# Patient Record
Sex: Female | Born: 1970 | Race: Black or African American | Hispanic: No | Marital: Single | Smoking: Never smoker
Health system: Southern US, Community
[De-identification: ages and names within clinical notes are randomized; demographics above are authoritative.]

## PROBLEM LIST (undated history)

## (undated) DIAGNOSIS — M069 Rheumatoid arthritis, unspecified: Secondary | ICD-10-CM

## (undated) HISTORY — PX: MASTECTOMY: SHX3

## (undated) HISTORY — PX: ABDOMINAL HYSTERECTOMY: SHX81

---

## 1997-10-19 ENCOUNTER — Encounter: Admission: RE | Admit: 1997-10-19 | Discharge: 1997-10-19 | Payer: Self-pay | Admitting: Family Medicine

## 1997-11-11 ENCOUNTER — Encounter: Admission: RE | Admit: 1997-11-11 | Discharge: 1997-11-11 | Payer: Self-pay | Admitting: Family Medicine

## 1997-11-17 ENCOUNTER — Encounter: Admission: RE | Admit: 1997-11-17 | Discharge: 1997-11-17 | Payer: Self-pay | Admitting: Family Medicine

## 1998-02-15 ENCOUNTER — Encounter: Admission: RE | Admit: 1998-02-15 | Discharge: 1998-02-15 | Payer: Self-pay | Admitting: Family Medicine

## 1998-02-22 ENCOUNTER — Encounter: Admission: RE | Admit: 1998-02-22 | Discharge: 1998-02-22 | Payer: Self-pay | Admitting: Family Medicine

## 1998-07-14 ENCOUNTER — Encounter: Admission: RE | Admit: 1998-07-14 | Discharge: 1998-07-14 | Payer: Self-pay | Admitting: Family Medicine

## 1998-08-02 ENCOUNTER — Other Ambulatory Visit: Admission: RE | Admit: 1998-08-02 | Discharge: 1998-08-02 | Payer: Self-pay | Admitting: Gynecology

## 1999-01-29 ENCOUNTER — Encounter: Admission: RE | Admit: 1999-01-29 | Discharge: 1999-01-29 | Payer: Self-pay | Admitting: Family Medicine

## 1999-02-21 ENCOUNTER — Encounter: Admission: RE | Admit: 1999-02-21 | Discharge: 1999-02-21 | Payer: Self-pay | Admitting: Family Medicine

## 1999-04-01 ENCOUNTER — Encounter (INDEPENDENT_AMBULATORY_CARE_PROVIDER_SITE_OTHER): Payer: Self-pay | Admitting: *Deleted

## 1999-04-01 LAB — CONVERTED CEMR LAB

## 1999-04-06 ENCOUNTER — Encounter: Admission: RE | Admit: 1999-04-06 | Discharge: 1999-04-06 | Payer: Self-pay | Admitting: Family Medicine

## 1999-04-16 ENCOUNTER — Encounter: Admission: RE | Admit: 1999-04-16 | Discharge: 1999-04-16 | Payer: Self-pay | Admitting: Family Medicine

## 1999-05-18 ENCOUNTER — Encounter: Admission: RE | Admit: 1999-05-18 | Discharge: 1999-05-18 | Payer: Self-pay | Admitting: Sports Medicine

## 1999-08-07 ENCOUNTER — Encounter: Admission: RE | Admit: 1999-08-07 | Discharge: 1999-08-07 | Payer: Self-pay | Admitting: Sports Medicine

## 1999-09-26 ENCOUNTER — Encounter: Admission: RE | Admit: 1999-09-26 | Discharge: 1999-09-26 | Payer: Self-pay | Admitting: Family Medicine

## 1999-10-23 ENCOUNTER — Encounter: Admission: RE | Admit: 1999-10-23 | Discharge: 1999-10-23 | Payer: Self-pay | Admitting: Sports Medicine

## 1999-12-26 ENCOUNTER — Encounter: Admission: RE | Admit: 1999-12-26 | Discharge: 1999-12-26 | Payer: Self-pay | Admitting: Family Medicine

## 2000-03-06 ENCOUNTER — Encounter: Admission: RE | Admit: 2000-03-06 | Discharge: 2000-03-06 | Payer: Self-pay | Admitting: Family Medicine

## 2000-05-13 ENCOUNTER — Encounter: Admission: RE | Admit: 2000-05-13 | Discharge: 2000-05-13 | Payer: Self-pay | Admitting: Sports Medicine

## 2001-02-26 ENCOUNTER — Other Ambulatory Visit: Admission: RE | Admit: 2001-02-26 | Discharge: 2001-02-26 | Payer: Self-pay | Admitting: Family Medicine

## 2001-03-13 ENCOUNTER — Ambulatory Visit (HOSPITAL_COMMUNITY): Admission: RE | Admit: 2001-03-13 | Discharge: 2001-03-13 | Payer: Self-pay | Admitting: Family Medicine

## 2002-11-04 ENCOUNTER — Other Ambulatory Visit: Admission: RE | Admit: 2002-11-04 | Discharge: 2002-11-04 | Payer: Self-pay | Admitting: Gynecology

## 2003-04-05 ENCOUNTER — Encounter: Admission: RE | Admit: 2003-04-05 | Discharge: 2003-04-05 | Payer: Self-pay | Admitting: Obstetrics and Gynecology

## 2003-06-14 ENCOUNTER — Other Ambulatory Visit: Admission: RE | Admit: 2003-06-14 | Discharge: 2003-06-14 | Payer: Self-pay | Admitting: Obstetrics & Gynecology

## 2003-06-14 ENCOUNTER — Encounter: Admission: RE | Admit: 2003-06-14 | Discharge: 2003-06-14 | Payer: Self-pay | Admitting: Obstetrics and Gynecology

## 2003-06-27 ENCOUNTER — Inpatient Hospital Stay (HOSPITAL_COMMUNITY): Admission: AD | Admit: 2003-06-27 | Discharge: 2003-06-27 | Payer: Self-pay | Admitting: Obstetrics and Gynecology

## 2003-06-30 ENCOUNTER — Encounter: Admission: RE | Admit: 2003-06-30 | Discharge: 2003-06-30 | Payer: Self-pay | Admitting: Obstetrics and Gynecology

## 2003-08-30 ENCOUNTER — Encounter: Admission: RE | Admit: 2003-08-30 | Discharge: 2003-08-30 | Payer: Self-pay | Admitting: Obstetrics and Gynecology

## 2003-09-28 ENCOUNTER — Inpatient Hospital Stay (HOSPITAL_COMMUNITY): Admission: RE | Admit: 2003-09-28 | Discharge: 2003-09-29 | Payer: Self-pay | Admitting: Obstetrics & Gynecology

## 2003-10-11 ENCOUNTER — Encounter: Admission: RE | Admit: 2003-10-11 | Discharge: 2003-10-11 | Payer: Self-pay | Admitting: Obstetrics and Gynecology

## 2003-11-08 ENCOUNTER — Encounter: Admission: RE | Admit: 2003-11-08 | Discharge: 2003-11-08 | Payer: Self-pay | Admitting: Obstetrics and Gynecology

## 2004-02-03 ENCOUNTER — Inpatient Hospital Stay (HOSPITAL_COMMUNITY): Admission: AD | Admit: 2004-02-03 | Discharge: 2004-02-03 | Payer: Self-pay | Admitting: *Deleted

## 2004-03-02 ENCOUNTER — Ambulatory Visit: Payer: Self-pay | Admitting: Family Medicine

## 2004-03-09 ENCOUNTER — Ambulatory Visit: Payer: Self-pay | Admitting: Internal Medicine

## 2004-03-16 ENCOUNTER — Ambulatory Visit: Payer: Self-pay | Admitting: Family Medicine

## 2004-03-27 ENCOUNTER — Ambulatory Visit: Payer: Self-pay | Admitting: Obstetrics & Gynecology

## 2004-04-10 ENCOUNTER — Ambulatory Visit: Payer: Self-pay | Admitting: Obstetrics & Gynecology

## 2004-04-17 ENCOUNTER — Ambulatory Visit: Payer: Self-pay | Admitting: Obstetrics & Gynecology

## 2006-08-29 ENCOUNTER — Encounter (INDEPENDENT_AMBULATORY_CARE_PROVIDER_SITE_OTHER): Payer: Self-pay | Admitting: *Deleted

## 2010-07-21 ENCOUNTER — Encounter: Payer: Self-pay | Admitting: Family Medicine

## 2013-01-08 ENCOUNTER — Encounter (HOSPITAL_COMMUNITY): Payer: Self-pay

## 2013-01-08 ENCOUNTER — Other Ambulatory Visit (HOSPITAL_COMMUNITY)
Admission: RE | Admit: 2013-01-08 | Discharge: 2013-01-08 | Disposition: A | Discharge status: Home / Self Care | Payer: 59 | Source: Ambulatory Visit | Attending: Family Medicine | Admitting: Family Medicine

## 2013-01-08 ENCOUNTER — Emergency Department (INDEPENDENT_AMBULATORY_CARE_PROVIDER_SITE_OTHER)
Admission: EM | Admit: 2013-01-08 | Discharge: 2013-01-08 | Disposition: A | Discharge status: Home / Self Care | Payer: 59 | Source: Home / Self Care

## 2013-01-08 DIAGNOSIS — Z113 Encounter for screening for infections with a predominantly sexual mode of transmission: Secondary | ICD-10-CM | POA: Insufficient documentation

## 2013-01-08 DIAGNOSIS — N898 Other specified noninflammatory disorders of vagina: Secondary | ICD-10-CM

## 2013-01-08 DIAGNOSIS — N760 Acute vaginitis: Secondary | ICD-10-CM | POA: Insufficient documentation

## 2013-01-08 LAB — POCT URINALYSIS DIP (DEVICE)
Bilirubin Urine: NEGATIVE
Leukocytes, UA: NEGATIVE
Nitrite: NEGATIVE
pH: 6 (ref 5.0–8.0)

## 2013-01-08 NOTE — ED Provider Notes (Signed)
Medical screening examination/treatment/procedure(s) were performed by resident physician or non-physician practitioner and as supervising physician I was immediately available for consultation/collaboration.   Barkley Bruns MD.   Linna Hoff, MD 01/08/13 1640

## 2013-01-08 NOTE — ED Notes (Signed)
Concern for UTI; frequency, lower abdominal pressure. DENIES ANY concern for STD's; needs medication for yeast w any antibitics

## 2013-01-08 NOTE — ED Notes (Signed)
Call back number for lab issues verified 

## 2013-01-08 NOTE — ED Provider Notes (Signed)
Gloria Bowman is a 42 y.o. female who presents to Urgent Care today for urinary tract infection. Patient notes urgency frequency and irritation over the last 2 days. This is consistent with prior UTIs. She denies any discharge but does note some mild vaginal irritation. She denies any fevers or chills nausea vomiting or diarrhea. She is well otherwise.    PMH reviewed. Otherwise healthy History  Substance Use Topics  . Smoking status: Not on file  . Smokeless tobacco: Not on file  . Alcohol Use: Not on file   ROS as above Medications reviewed. No current facility-administered medications for this encounter.   No current outpatient prescriptions on file.    Exam:  BP 117/80  Pulse 90  Temp(Src) 98 F (36.7 C) (Oral)  Resp 18  SpO2 100% Gen: Well NAD HEENT: EOMI,  MMM Lungs: CTABL Nl WOB Heart: RRR no MRG Abd: NABS, NT, ND Exts: Non edematous BL  LE, warm and well perfused.  Vaginal exam: Normal external genitalia Thick white discharge in vaginal canal Normal-appearing cervix No cervical motion tenderness or masses with bimanual exam  Results for orders placed during the hospital encounter of 01/08/13 (from the past 24 hour(s))  POCT URINALYSIS DIP (DEVICE)     Status: None   Collection Time    01/08/13 12:20 PM      Result Value Range   Glucose, UA NEGATIVE  NEGATIVE mg/dL   Bilirubin Urine NEGATIVE  NEGATIVE   Ketones, ur NEGATIVE  NEGATIVE mg/dL   Specific Gravity, Urine >=1.030  1.005 - 1.030   Hgb urine dipstick NEGATIVE  NEGATIVE   pH 6.0  5.0 - 8.0   Protein, ur NEGATIVE  NEGATIVE mg/dL   Urobilinogen, UA 0.2  0.0 - 1.0 mg/dL   Nitrite NEGATIVE  NEGATIVE   Leukocytes, UA NEGATIVE  NEGATIVE   No results found.  Assessment and Plan: 42 y.o. female with dysuria with vaginal discharge. Unclear cause at this time Plan: Urine culture, wet prep, GC, chlamydia testing Will call patient with results and started appropriate treatment at that time Discussed warning  signs or symptoms. Please see discharge instructions. Patient expresses understanding.      Rodolph Bong, MD 01/08/13 1248

## 2013-01-09 LAB — URINE CULTURE

## 2013-01-11 ENCOUNTER — Telehealth (HOSPITAL_COMMUNITY): Payer: Self-pay | Admitting: Family Medicine

## 2013-01-11 MED ORDER — METRONIDAZOLE 500 MG PO TABS: 500.0000 mg | ORAL_TABLET | Freq: Two times a day (BID) | ORAL | Status: DC

## 2013-01-11 NOTE — ED Notes (Signed)
Called patient back with test results.  Will treat with flagyl  Rodolph Bong, MD 01/11/13 1500

## 2013-01-12 NOTE — ED Notes (Signed)
GC/Chlamydia neg., Affirm: Candida and Trich neg., Gardnerella pos., Urine culture: 2,000 colonies insignificant growth.  Pt. adequately treated with Flagyl. 7/14  Pt. notified and treated by Dr. Denyse Amass. Vassie Moselle 01/12/2013

## 2017-02-03 ENCOUNTER — Encounter (HOSPITAL_BASED_OUTPATIENT_CLINIC_OR_DEPARTMENT_OTHER): Payer: Self-pay | Admitting: Emergency Medicine

## 2017-02-03 ENCOUNTER — Emergency Department (HOSPITAL_BASED_OUTPATIENT_CLINIC_OR_DEPARTMENT_OTHER): Payer: Self-pay

## 2017-02-03 ENCOUNTER — Emergency Department (HOSPITAL_BASED_OUTPATIENT_CLINIC_OR_DEPARTMENT_OTHER)
Admission: EM | Admit: 2017-02-03 | Discharge: 2017-02-03 | Disposition: A | Discharge status: Home / Self Care | Payer: Self-pay | Attending: Emergency Medicine | Admitting: Emergency Medicine

## 2017-02-03 DIAGNOSIS — Y92008 Other place in unspecified non-institutional (private) residence as the place of occurrence of the external cause: Secondary | ICD-10-CM | POA: Insufficient documentation

## 2017-02-03 DIAGNOSIS — S3210XA Unspecified fracture of sacrum, initial encounter for closed fracture: Secondary | ICD-10-CM | POA: Insufficient documentation

## 2017-02-03 DIAGNOSIS — Y9389 Activity, other specified: Secondary | ICD-10-CM | POA: Insufficient documentation

## 2017-02-03 DIAGNOSIS — E860 Dehydration: Secondary | ICD-10-CM | POA: Insufficient documentation

## 2017-02-03 DIAGNOSIS — W108XXA Fall (on) (from) other stairs and steps, initial encounter: Secondary | ICD-10-CM | POA: Insufficient documentation

## 2017-02-03 DIAGNOSIS — Y999 Unspecified external cause status: Secondary | ICD-10-CM | POA: Insufficient documentation

## 2017-02-03 DIAGNOSIS — M069 Rheumatoid arthritis, unspecified: Secondary | ICD-10-CM | POA: Insufficient documentation

## 2017-02-03 DIAGNOSIS — R42 Dizziness and giddiness: Secondary | ICD-10-CM | POA: Insufficient documentation

## 2017-02-03 DIAGNOSIS — W19XXXA Unspecified fall, initial encounter: Secondary | ICD-10-CM

## 2017-02-03 HISTORY — DX: Rheumatoid arthritis, unspecified: M06.9

## 2017-02-03 LAB — COMPREHENSIVE METABOLIC PANEL
ALT: 14 U/L (ref 14–54)
ANION GAP: 7 (ref 5–15)
AST: 19 U/L (ref 15–41)
Albumin: 3.8 g/dL (ref 3.5–5.0)
Alkaline Phosphatase: 63 U/L (ref 38–126)
BUN: 15 mg/dL (ref 6–20)
CHLORIDE: 102 mmol/L (ref 101–111)
CO2: 28 mmol/L (ref 22–32)
Calcium: 9.2 mg/dL (ref 8.9–10.3)
Creatinine, Ser: 0.69 mg/dL (ref 0.44–1.00)
GFR calc non Af Amer: >60 mL/min (ref >60)
Glucose, Bld: 101 mg/dL — ABNORMAL HIGH (ref 65–99)
POTASSIUM: 4.3 mmol/L (ref 3.5–5.1)
SODIUM: 137 mmol/L (ref 135–145)
Total Bilirubin: 0.6 mg/dL (ref 0.3–1.2)
Total Protein: 7.5 g/dL (ref 6.5–8.1)

## 2017-02-03 LAB — CBC WITH DIFFERENTIAL/PLATELET
Basophils Absolute: 0 10*3/uL (ref 0.0–0.1)
Basophils Relative: 0 %
EOS ABS: 0 10*3/uL (ref 0.0–0.7)
EOS PCT: 0 %
HCT: 40.4 % (ref 36.0–46.0)
Hemoglobin: 13.3 g/dL (ref 12.0–15.0)
Lymphocytes Relative: 19 %
Lymphs Abs: 1.3 10*3/uL (ref 0.7–4.0)
MCH: 29.4 pg (ref 26.0–34.0)
MCHC: 32.9 g/dL (ref 30.0–36.0)
MCV: 89.4 fL (ref 78.0–100.0)
MONO ABS: 0.4 10*3/uL (ref 0.1–1.0)
Monocytes Relative: 6 %
Neutro Abs: 5 10*3/uL (ref 1.7–7.7)
Neutrophils Relative %: 75 %
PLATELETS: 149 10*3/uL — AB (ref 150–400)
RBC: 4.52 MIL/uL (ref 3.87–5.11)
RDW: 12.7 % (ref 11.5–15.5)
WBC: 6.7 10*3/uL (ref 4.0–10.5)

## 2017-02-03 LAB — URINALYSIS, ROUTINE W REFLEX MICROSCOPIC
BILIRUBIN URINE: NEGATIVE
Glucose, UA: NEGATIVE mg/dL
HGB URINE DIPSTICK: NEGATIVE
KETONES UR: NEGATIVE mg/dL
Leukocytes, UA: NEGATIVE
Nitrite: NEGATIVE
PROTEIN: NEGATIVE mg/dL
Specific Gravity, Urine: 1.02 (ref 1.005–1.030)
pH: 7.5 (ref 5.0–8.0)

## 2017-02-03 MED ORDER — TRAMADOL HCL 50 MG PO TABS: 50.0000 mg | ORAL_TABLET | Freq: Four times a day (QID) | ORAL | 0 refills | Status: DC | PRN

## 2017-02-03 MED ORDER — SODIUM CHLORIDE 0.9 % IV BOLUS (SEPSIS)
1000.0000 mL | Freq: Once | INTRAVENOUS | Status: AC
  Administered 2017-02-03: 1000 mL via INTRAVENOUS

## 2017-02-03 MED ORDER — METOCLOPRAMIDE HCL 5 MG/ML IJ SOLN
10.0000 mg | Freq: Once | INTRAMUSCULAR | Status: AC
  Administered 2017-02-03: 10 mg via INTRAVENOUS
  Filled 2017-02-03: qty 2

## 2017-02-03 MED ORDER — IBUPROFEN 800 MG PO TABS: 800.0000 mg | ORAL_TABLET | Freq: Three times a day (TID) | ORAL | 0 refills | Status: DC | PRN

## 2017-02-03 MED ORDER — KETOROLAC TROMETHAMINE 30 MG/ML IJ SOLN
30.0000 mg | Freq: Once | INTRAMUSCULAR | Status: AC
Start: 2017-02-03 — End: 2017-02-03
  Administered 2017-02-03: 30 mg via INTRAVENOUS
  Filled 2017-02-03: qty 1

## 2017-02-03 MED FILL — IBUPROFEN 800 MG TABLET: 800 | 7 days supply | Qty: 21 | Fill #0

## 2017-02-03 NOTE — ED Triage Notes (Signed)
Patient reports feeling dizzy which began this morning after waking up.  States that she fell and hit her "tailbone".  Seen by PCP this morning and referred to ER for further workup.  Reports feeling fatigued recently.

## 2017-02-03 NOTE — Discharge Instructions (Signed)
As we discussed, we believe her symptoms are caused today by mild volume depletion, or mild dehydration, without any evidence of damage to your body.  Please drink plenty of clear fluids such as water and/or Gatorade and follow up with your regular doctor or the doctors listed in his documentation at the next available opportunity.  Return to the emergency department with any new or worsening symptoms that concern you, including but not limited to fever, shortness of breath, chest pain, or other concerning symptoms.  Follow up with ortho in 1 week.    Dehydration, Adult Dehydration is when you lose more fluids from the body than you take in. Vital organs like the kidneys, brain, and heart cannot function without a proper amount of fluids and salt. Any loss of fluids from the body can cause dehydration.  CAUSES  Vomiting. Diarrhea. Excessive sweating. Excessive urine output. Fever. SYMPTOMS  Mild dehydration Thirst. Dry lips. Slightly dry mouth. Moderate dehydration Very dry mouth. Sunken eyes. Skin does not bounce back quickly when lightly pinched and released. Dark urine and decreased urine production. Decreased tear production. Headache. Severe dehydration Very dry mouth. Extreme thirst. Rapid, weak pulse (more than 100 beats per minute at rest). Cold hands and feet. Not able to sweat in spite of heat and temperature. Rapid breathing. Blue lips. Confusion and lethargy. Difficulty being awakened. Minimal urine production. No tears. DIAGNOSIS  Your caregiver will diagnose dehydration based on your symptoms and your exam. Blood and urine tests will help confirm the diagnosis. The diagnostic evaluation should also identify the cause of dehydration. TREATMENT  Treatment of mild or moderate dehydration can often be done at home by increasing the amount of fluids that you drink. It is best to drink small amounts of fluid more often. Drinking too much at one time can make vomiting  worse. Refer to the home care instructions below. Severe dehydration needs to be treated at the hospital where you will probably be given intravenous (IV) fluids that contain water and electrolytes. HOME CARE INSTRUCTIONS  Ask your caregiver about specific rehydration instructions. Drink enough fluids to keep your urine clear or pale yellow. Drink small amounts frequently if you have nausea and vomiting. Eat as you normally do. Avoid: Foods or drinks high in sugar. Carbonated drinks. Juice. Extremely hot or cold fluids. Drinks with caffeine. Fatty, greasy foods. Alcohol. Tobacco. Overeating. Gelatin desserts. Wash your hands well to avoid spreading bacteria and viruses. Only take over-the-counter or prescription medicines for pain, discomfort, or fever as directed by your caregiver. Ask your caregiver if you should continue all prescribed and over-the-counter medicines. Keep all follow-up appointments with your caregiver. SEEK MEDICAL CARE IF: You have abdominal pain and it increases or stays in one area (localizes). You have a rash, stiff neck, or severe headache. You are irritable, sleepy, or difficult to awaken. You are weak, dizzy, or extremely thirsty. SEEK IMMEDIATE MEDICAL CARE IF:  You are unable to keep fluids down or you get worse despite treatment. You have frequent episodes of vomiting or diarrhea. You have blood or green matter (bile) in your vomit. You have blood in your stool or your stool looks black and tarry. You have not urinated in 6 to 8 hours, or you have only urinated a small amount of very dark urine. You have a fever. You faint. MAKE SURE YOU:  Understand these instructions. Will watch your condition. Will get help right away if you are not doing well or get worse. Document Released: 06/17/2005 Document  Revised: 09/09/2011 Document Reviewed: 02/04/2011 [facility] Patient Information 2015 [place], [place]. This information is not intended to replace advice  given to you by your health care provider. Make sure you discuss any questions you have with your health care provider.  Rehydration, Adult Rehydration is the replacement of body fluids lost during dehydration. Dehydration is an extreme loss of body fluids to the point of body function impairment. There are many ways extreme fluid loss can occur, including vomiting, diarrhea, or excess sweating. Recovering from dehydration requires replacing lost fluids, continuing to eat to maintain strength, and avoiding foods and beverages that may contribute to further fluid loss or may increase nausea. HOW TO REHYDRATE In most cases, rehydration involves the replacement of not only fluids but also carbohydrates and basic body salts. Rehydration with an oral rehydration solution is one way to replace essential nutrients lost through dehydration. An oral rehydration solution can be purchased at pharmacies, retail stores, and online. Premixed packets of powder that you combine with water to make a solution are also sold. You can prepare an oral rehydration solution at home by mixing the following ingredients together:   - tsp table salt.  tsp baking soda.  tsp salt substitute containing potassium chloride. 1 tablespoons sugar. 1 L (34 oz) of water. Be sure to use exact measurements. Including too much sugar can make diarrhea worse. Drink -1 cup (120-240 mL) of oral rehydration solution each time you have diarrhea or vomit. If drinking this amount makes your vomiting worse, try drinking smaller amounts more often. For example, drink 1-3 tsp every 5-10 minutes.  A general rule for staying hydrated is to drink 1-2 L of fluid per day. Talk to your caregiver about the specific amount you should be drinking each day. Drink enough fluids to keep your urine clear or pale yellow. EATING WHEN DEHYDRATED Even if you have had severe sweating or you are having diarrhea, do not stop eating. Many healthy items in a normal diet  are okay to continue eating while recovering from dehydration. The following tips can help you to lessen nausea when you eat: Ask someone else to prepare your food. Cooking smells may worsen nausea. Eat in a well-ventilated room away from cooking smells. Sit up when you eat. Avoid lying down until 1-2 hours after eating. Eat small amounts when you eat. Eat foods that are easy to digest. These include soft, well-cooked, or mashed foods. FOODS AND BEVERAGES TO AVOID Avoid eating or drinking the following foods and beverages that may increase nausea or further loss of fluid:  Fruit juices with a high sugar content, such as concentrated juices. Alcohol. Beverages containing caffeine. Carbonated drinks. They may cause a lot of gas. Foods that may cause a lot of gas, such as cabbage, broccoli, and beans. Fatty, greasy, and fried foods. Spicy, very salty, and very sweet foods or drinks. Foods or drinks that are very hot or very cold. Consume food or drinks at or near room temperature. Foods that need a lot of chewing, such as raw vegetables. Foods that are sticky or hard to swallow, such as peanut butter. Document Released: 09/09/2011 Document Revised: 03/11/2012 Document Reviewed: 09/09/2011 [facility] Patient Information 2015 [place], [place]. This information is not intended to replace advice given to you by your health care provider. Make sure you discuss any questions you have with your health care provider.

## 2017-02-03 NOTE — ED Provider Notes (Signed)
Emergency Department Provider Note   I have reviewed the triage vital signs and the nursing notes.   HISTORY  Chief Complaint Dizziness   HPI CANESHA TESFAYE is a 46 y.o. female with PMH of RA presents to the emergency department for evaluation of sudden onset lightheadedness, severe fatigue, migraine headache, and nausea. Symptoms began shortly after waking this morning. The patient stood up out of bed and began walking down the steps at which point she felt very lightheaded. She lost her footing and fell down directly onto her buttocks. She has pain in that area was able to drive to work. On driving and she felt severe fatigue and nausea since returned home. At that time she developed a migraine type headache that feels typical of her prior migraines. She had some spots in her vision which is also typical of that. She denies any vertigo symptoms. As any unilateral weakness or numbness. She has had some urinary urgency but no hesitancy or dysuria. Denies any vaginal bleeding or discharge. She called her primary care physician and went to urgent care. She reports low blood pressures there but cannot recall the exact numbers. No chest pain, palpitations, shortness of breath during the episode.   Past Medical History:  Diagnosis Date  . RA (rheumatoid arthritis) (HCC)     There are no active problems to display for this patient.   Past Surgical History:  Procedure Laterality Date  . ABDOMINAL HYSTERECTOMY    . MASTECTOMY      Current Outpatient Rx  . Order #: [id number] Class: Print  . Order #: [id number] Class: Normal  . Order #: [id number] Class: Print    Allergies Patient has no known allergies.  History reviewed. No pertinent family history.  Social History Social History  Substance Use Topics  . Smoking status: Never Smoker  . Smokeless tobacco: Never Used  . Alcohol use No    Review of Systems  Constitutional: No fever/chills. Positive fatigue and lightheadedness.    Eyes: Positive visual changes with migraine HA symptoms.  ENT: No sore throat. Cardiovascular: Denies chest pain. Respiratory: Denies shortness of breath. Gastrointestinal: No abdominal pain. No nausea, no vomiting.  No diarrhea.  No constipation. Genitourinary: Negative for dysuria. Positive urinary urgency.  Musculoskeletal: Negative for back pain. Positive buttock pain.  Skin: Negative for rash. Neurological: Negative for focal weakness or numbness. Positive typical migraine HA.   10-point ROS otherwise negative.  ____________________________________________   PHYSICAL EXAM:  VITAL SIGNS: ED Triage Vitals  Enc Vitals Group     BP 02/03/17 1046 124/66     Pulse Rate 02/03/17 1046 83     Resp 02/03/17 1046 18     Temp 02/03/17 1046 97.7 F (36.5 C)     Temp Source 02/03/17 1046 Oral     SpO2 02/03/17 1046 100 %     Pain Score 02/03/17 1045 8   Constitutional: Alert and oriented. Well appearing and in no acute distress. Eyes: Conjunctivae are normal. Head: Atraumatic. Nose: No congestion/rhinnorhea. Mouth/Throat: Mucous membranes are moist.  Neck: No stridor. Cardiovascular: Normal rate, regular rhythm. Good peripheral circulation. Grossly normal heart sounds.   Respiratory: Normal respiratory effort.  No retractions. Lungs CTAB. Gastrointestinal: Soft and nontender. No distention.  Musculoskeletal: No lower extremity tenderness nor edema. No gross deformities of extremities. Positive tenderness over the coccyx and sacrum. No midline thoracic or lumbar spine tenderness.  Neurologic:  Normal speech and language. No gross focal neurologic deficits are appreciated. Normal CN exam 2-12. No pronator  drift.  Skin:  Skin is warm, dry and intact. No rash noted.  ____________________________________________   LABS (all labs ordered are listed, but only abnormal results are displayed)  Labs Reviewed  CBC WITH DIFFERENTIAL/PLATELET - Abnormal; Notable for the following:        Result Value   Platelets 149 (*)    All other components within normal limits  URINALYSIS, ROUTINE W REFLEX MICROSCOPIC - Abnormal; Notable for the following:    APPearance CLOUDY (*)    All other components within normal limits  COMPREHENSIVE METABOLIC PANEL - Abnormal; Notable for the following:    Glucose, Bld 101 (*)    All other components within normal limits  URINE CULTURE   ____________________________________________  EKG   EKG Interpretation  Date/Time:  Monday February 03 2017 11:12:22 EDT Ventricular Rate:  80 PR Interval:    QRS Duration: 72 QT Interval:  368 QTC Calculation: 425 R Axis:   55 Text Interpretation:  Sinus rhythm Baseline wander in lead(s) V1 V4 No STEMI.  Confirmed by Alona Bene [phone]) on 02/03/2017 11:15:29 AM       ____________________________________________  RADIOLOGY  Dg Sacrum/coccyx  Result Date: 02/03/2017 CLINICAL DATA:  Recent fall with buttock pain, initial encounter EXAM: SACRUM AND COCCYX - 2+ VIEW COMPARISON:  None. FINDINGS: The pelvic ring is intact. The coccyx is somewhat orientated anteriorly although there is a defect in the distal aspect of the sacrum anteriorly consistent with a minimally displaced fracture. No other fractures are seen. IMPRESSION: Mildly displaced distal sacral fracture Electronically Signed   By: Alcide Clever M.D.   On: 02/03/2017 11:42    ____________________________________________   PROCEDURES  Procedure(s) performed:   Procedures  None ____________________________________________   INITIAL IMPRESSION / ASSESSMENT AND PLAN / ED COURSE  Pertinent labs & imaging results that were available during my care of the patient were reviewed by me and considered in my medical decision making (see chart for details).  Patient presents to the emergency department for evaluation of fatigue, lightheadedness, near syncope, nausea, headache. Symptoms seem consistent with orthostatic hypotension. No clinical  evidence to suggest PE. Unclear if this is related to dehydration, anemia, or other acute process. Patient also developing a typical migraine headache. No focal neurological deficits to suspect acute stroke, SAH, or other neurological emergency. Plan for IVF, EKG, and labs. Will image the sacrum with significant tenderness on exam.   Labs normal. Slightly displaced sacral fracture. Discussed with ortho on call who agrees with office follow up.   At this time, I do not feel there is any life-threatening condition present. I have reviewed and discussed all results (EKG, imaging, lab, urine as appropriate), exam findings with patient. I have reviewed nursing notes and appropriate previous records.  I feel the patient is safe to be discharged home without further emergent workup. Discussed usual and customary return precautions. Patient and family (if present) verbalize understanding and are comfortable with this plan.  Patient will follow-up with their primary care provider. If they do not have a primary care provider, information for follow-up has been provided to them. All questions have been answered.  ____________________________________________  FINAL CLINICAL IMPRESSION(S) / ED DIAGNOSES  Final diagnoses:  Dizziness  Dehydration  Fall, initial encounter  Closed fracture of sacrum, unspecified portion of sacrum, initial encounter [facility])     MEDICATIONS GIVEN DURING THIS VISIT:  Medications  sodium chloride 0.9 % bolus 1,000 mL (0 mLs Intravenous Stopped 02/03/17 1406)  ketorolac (TORADOL) 30 MG/ML injection 30  mg (30 mg Intravenous Given 02/03/17 1241)  metoCLOPramide (REGLAN) injection 10 mg (10 mg Intravenous Given 02/03/17 1241)     NEW OUTPATIENT MEDICATIONS STARTED DURING THIS VISIT:  Discharge Medication List as of 02/03/2017  2:03 PM    START taking these medications   Details  ibuprofen (ADVIL,MOTRIN) 800 MG tablet Take 1 tablet (800 mg total) by mouth every 8 (eight) hours as  needed., Starting Mon 02/03/2017, Print    traMADol (ULTRAM) 50 MG tablet Take 1 tablet (50 mg total) by mouth every 6 (six) hours as needed., Starting Mon 02/03/2017, Print        Note:  This document was prepared using Dragon voice recognition software and may include unintentional dictation errors.  Alona Bene, MD Emergency Medicine   Mycah Mcdougall, Arlyss Repress, MD 02/03/17 [phone]

## 2017-02-03 NOTE — ED Notes (Signed)
ED Provider at bedside. 

## 2017-02-04 LAB — URINE CULTURE
CULTURE: NO GROWTH
SPECIAL REQUESTS: NORMAL

## 2017-02-18 ENCOUNTER — Ambulatory Visit (INDEPENDENT_AMBULATORY_CARE_PROVIDER_SITE_OTHER): Payer: BLUE CROSS/BLUE SHIELD | Admitting: Orthopaedic Surgery

## 2017-02-18 ENCOUNTER — Encounter (INDEPENDENT_AMBULATORY_CARE_PROVIDER_SITE_OTHER): Payer: Self-pay | Admitting: Orthopaedic Surgery

## 2017-02-18 DIAGNOSIS — S32130A Nondisplaced Zone III fracture of sacrum, initial encounter for closed fracture: Secondary | ICD-10-CM | POA: Insufficient documentation

## 2017-02-18 MED ORDER — IBUPROFEN 800 MG PO TABS: 800.0000 mg | ORAL_TABLET | Freq: Three times a day (TID) | ORAL | 3 refills | Status: DC | PRN

## 2017-02-18 NOTE — Progress Notes (Signed)
   Office Visit Note   Patient: Gloria Bowman           Date of Birth: [birth year]           MRN: [id number] Visit Date: 02/18/2017              Requested by: No referring provider defined for this encounter. PCP: Patient, No Pcp Per   Assessment & Plan: Visit Diagnoses:  1. Closed nondisplaced zone III fracture of sacrum, initial encounter (HCC)     Plan: X-rays show an complaint nondisplaced fracture of the distal sacrum. Patient is doing well. Ibuprofen refill. Work note given to her and turned to full duty. Follow up as needed. Questions encouraged and answered.  Follow-Up Instructions: Return if symptoms worsen or fail to improve.   Orders:  No orders of the defined types were placed in this encounter.  Meds ordered this encounter  Medications  . ibuprofen (ADVIL,MOTRIN) 800 MG tablet    Sig: Take 1 tablet (800 mg total) by mouth every 8 (eight) hours as needed.    Dispense:  60 tablet    Refill:  3      Procedures: No procedures performed   Clinical Data: No additional findings.   Subjective: Chief Complaint  Patient presents with  . Lower Back - Pain, New Patient (Initial Visit)    Gloria Bowman is a 46 year old healthy female who fell on her buttock 2 weeks ago and sustained incomplete nondisplaced distal sacral fracture. She is doing much better. She taking ibuprofen. Denies any numbness and tingling.    Review of Systems  Constitutional: Negative.   HENT: Negative.   Eyes: Negative.   Respiratory: Negative.   Cardiovascular: Negative.   Endocrine: Negative.   Musculoskeletal: Negative.   Neurological: Negative.   Hematological: Negative.   Psychiatric/Behavioral: Negative.   All other systems reviewed and are negative.    Objective: Vital Signs: There were no vitals taken for this visit.  Physical Exam  Constitutional: She is oriented to person, place, and time. She appears well-developed and well-nourished.  HENT:  Head: Normocephalic and  atraumatic.  Eyes: EOM are normal.  Neck: Neck supple.  Pulmonary/Chest: Effort normal.  Abdominal: Soft.  Neurological: She is alert and oriented to person, place, and time.  Skin: Skin is warm. Capillary refill takes less than 2 seconds.  Psychiatric: She has a normal mood and affect. Her behavior is normal. Judgment and thought content normal.  Nursing note and vitals reviewed.   Ortho Exam Pelvic exam shows mild tenderness over the distal portion of the sacrum. Otherwise benign exam. Specialty Comments:  No specialty comments available.  Imaging: No results found.   PMFS History: Patient Active Problem List   Diagnosis Date Noted  . Closed nondisplaced zone III fracture of sacrum (HCC) 02/18/2017   Past Medical History:  Diagnosis Date  . RA (rheumatoid arthritis) (HCC)     No family history on file.  Past Surgical History:  Procedure Laterality Date  . ABDOMINAL HYSTERECTOMY    . MASTECTOMY     Social History   Occupational History  . Not on file.   Social History Main Topics  . Smoking status: Never Smoker  . Smokeless tobacco: Never Used  . Alcohol use No  . Drug use: No  . Sexual activity: Not on file

## 2018-04-22 ENCOUNTER — Other Ambulatory Visit (INDEPENDENT_AMBULATORY_CARE_PROVIDER_SITE_OTHER): Payer: Self-pay | Admitting: Orthopaedic Surgery

## 2020-02-01 ENCOUNTER — Ambulatory Visit: Payer: BLUE CROSS/BLUE SHIELD | Admitting: Rehabilitation

## 2020-02-07 ENCOUNTER — Encounter: Payer: Self-pay | Admitting: Physical Therapy

## 2020-02-07 ENCOUNTER — Ambulatory Visit: Payer: BLUE CROSS/BLUE SHIELD | Attending: Plastic Surgery | Admitting: Physical Therapy

## 2020-02-07 ENCOUNTER — Other Ambulatory Visit: Payer: Self-pay

## 2020-02-07 DIAGNOSIS — L599 Disorder of the skin and subcutaneous tissue related to radiation, unspecified: Secondary | ICD-10-CM | POA: Insufficient documentation

## 2020-02-07 DIAGNOSIS — R293 Abnormal posture: Secondary | ICD-10-CM | POA: Diagnosis present

## 2020-02-07 DIAGNOSIS — I972 Postmastectomy lymphedema syndrome: Secondary | ICD-10-CM | POA: Insufficient documentation

## 2020-02-07 DIAGNOSIS — M25611 Stiffness of right shoulder, not elsewhere classified: Secondary | ICD-10-CM | POA: Insufficient documentation

## 2020-02-07 DIAGNOSIS — M25511 Pain in right shoulder: Secondary | ICD-10-CM | POA: Diagnosis present

## 2020-02-07 NOTE — Therapy (Signed)
[facility] [address] [place], [place], [address] Phone: [phone]   Fax:  [phone]  Physical Therapy Evaluation  Patient Details  Name: Gloria Bowman MRN: [id number] Date of Birth: Apr 02, 1971 Referring Provider (PT): Thompson Caul   Encounter Date: 02/07/2020   PT End of Session - 02/07/20 1548    Visit Number 1    Number of Visits 9    Date for PT Re-Evaluation 03/06/20    PT Start Time 1504    PT Stop Time 1546    PT Time Calculation (min) 42 min    Activity Tolerance Patient tolerated treatment well    Behavior During Therapy [facility] for tasks assessed/performed           Past Medical History:  Diagnosis Date   RA (rheumatoid arthritis) (HCC)     Past Surgical History:  Procedure Laterality Date   ABDOMINAL HYSTERECTOMY     MASTECTOMY      There were no vitals filed for this visit.    Subjective Assessment - 02/07/20 1509    Subjective I got some therapy for this in the past. I got a sleeve and I wear it if I use the arm more than usual. 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 After I cleaned and took the sleeve off my arm really hurt and it was sore. That is when I reached out to my doctor.    Pertinent History R breast cancer, invasive lobular carcinoma stage 2, mets to 1 to 3 nodes, R lumpectomy and SLNB 01/30/2016, R mastectomy 03/12/16, completed chemo and radiation, lat flap reconstruction, RA    Patient Stated Goals to get more strength in the arm and to have more mobility    Currently in Pain? No/denies              [facility] PT Assessment - 02/07/20 0001      Assessment   Medical Diagnosis right breast cancer    Referring Provider (PT) P[place]    Onset Date/Surgical Date 01/30/16    Hand Dominance Right    Prior Therapy in 2017 or 2018 PT for cording, and lymphedema      Precautions   Precautions Other (comment)    Precaution Comments at risk of lymphedema      Restrictions    Weight Bearing Restrictions No      Balance Screen   Has the patient fallen in the past 6 months No    Has the patient had a decrease in activity level because of a fear of falling?  No    Is the patient reluctant to leave their home because of a fear of falling?  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    Leisure pt does not regulary exercise      Cognition   Overall Cognitive Status Within Functional Limits for tasks assessed      Observation/Other Assessments   Observations R UE is visibly swollen compared to L      Posture/Postural Control   Posture/Postural  Control Postural limitations    Postural Limitations Rounded Shoulders;Forward head      ROM / Strength   AROM / PROM / Strength AROM      AROM   AROM Assessment Site Shoulder    Right/Left Shoulder Right;Left    Right Shoulder Flexion 150 Degrees    Right Shoulder ABduction 130 Degrees    Right Shoulder Internal Rotation 39 Degrees    Right Shoulder External Rotation 87 Degrees    Left Shoulder Flexion 170 Degrees    Left Shoulder ABduction 176 Degrees    Left Shoulder Internal Rotation 50 Degrees    Left Shoulder External Rotation 90 Degrees             LYMPHEDEMA/ONCOLOGY QUESTIONNAIRE - 02/07/20 0001      Type   Cancer Type right breast cancer      Surgeries   Mastectomy Date 03/12/16    Lumpectomy Date 01/30/16    Sentinel Lymph Node Biopsy Date 01/30/16    Number Lymph Nodes Removed 3      Date Lymphedema/Swelling Started   Date 01/30/16      Treatment   Active Chemotherapy Treatment No    Past Chemotherapy Treatment Yes    Active Radiation  Treatment No    Past Radiation Treatment Yes    Current Hormone Treatment Yes    Drug Name Anastrozole    Past Hormone Therapy No      What other symptoms do you have   Are you Having Heaviness or Tightness Yes    Are you having Pain Yes    Are you having pitting edema No    Is it Hard or Difficult finding clothes that fit Yes    Do you have infections No    Is there Decreased scar mobility Yes      Lymphedema Assessments   Lymphedema Assessments Upper extremities      Right Upper Extremity Lymphedema   15 cm Proximal to Olecranon Process 33.2 cm    Olecranon Process 25 cm    15 cm Proximal to Ulnar Styloid Process 24 cm    Just Proximal to Ulnar Styloid Process 15.4 cm    Across Hand at [facility] 18.3 cm    At [place] of 2nd Digit 5.9 cm      Left Upper Extremity Lymphedema   15 cm Proximal to Olecranon Process 32.5 cm    Olecranon Process 24 cm    15 cm Proximal to Ulnar Styloid Process 23.5 cm    Just Proximal to Ulnar Styloid Process 14.5 cm    Across Hand at [facility] 18 cm    At [place] of 2nd Digit 6 cm                   [facility]patient [facility]07/[facility]  Lymphedema Life Impact Scale Total Score 50 %      Objective measurements completed on examination: See above findings.                    PT Long Term Goals - 02/07/20 1555      PT LONG TERM GOAL #1   Title Pt will receive appropriate compression garments for long term management of lymphedema.    Time 4    Period Weeks    Status New    Target Date 03/06/20      PT LONG TERM GOAL #2   Title Pt will demonstrate  165 degrees of R shoulder abduction to allow her to reach out to the sides.    Baseline 130    Time 4    Period Weeks    Status New    Target Date 03/06/20      PT LONG TERM GOAL #3   Title Pt will be indepdent in self MLD for long term management of lymphedema.    Time 4    Period Weeks    Status New    Target  Date 03/06/20      PT LONG TERM GOAL #4   Title Pt will be independent in a home exercise program for continued strengthening and stretching.    Time 4    Period Weeks    Status New    Target Date 03/06/20      PT LONG TERM GOAL #5   Title Pt will report a 75% improvement in pain and tightness in R upper quadrant to allow improved comfort.    Time 4    Period Weeks    Status New    Target Date 03/06/20                  Plan - 02/07/20 1549    Clinical Impression Statement Pt presents to PT with decreased R shoulder ROM and R UE and trunk lymphedema. Pt underwent a lumpectomy, SLNB and later a mastectomy in 2017. She completed chemo and radiation and had a lat flap reconstruction. She developed swelling in her arm after the surgery and had a compression sleeve that she wears when she uses her arm a lot. She reports at times it is difficult to find clothes that fit. She was seen by PT soon after surgery for cording in RUE. She presents with decreased R shoulder ROM and increased tightness in musculature throughout R upper quadrant. Pt would benefit from skilled PT services to decrease R UE lymphedema and assist pt with obtaining appropriate compression garments, improve R shoulder ROM and decrease muscle tightness and discomfort.    Personal Factors and Comorbidities Comorbidity 1;Time since onset of injury/illness/exacerbation    Comorbidities RA    Examination-Activity Limitations Bathing;Reach Overhead;Sleep    Examination-Participation Restrictions Community Activity;Cleaning;Occupation    Stability/Clinical Decision Making Stable/Uncomplicated    Clinical Decision Making Low    Rehab Potential Good    PT Frequency 2x / week    PT Duration 4 weeks    PT Treatment/Interventions ADLs/Self Care Home Management;Therapeutic exercise;Patient/family education;Manual techniques;Manual lymph drainage;Orthotic Fit/Training;Compression bandaging;Taping;Passive range of motion;Joint  Manipulations;Scar mobilization    PT Next Visit Plan see if pt found her FlexiTouch, check for signed Rx for garments, have pt bring in current sleeve and glove, educate on lymphedema, begin PROM to R shoulder, STM to R upper quadrant, instruct in self MLD    Recommended Other Services sent Rx to Dr to get signed for sleeve, glove and compression tank- will send to [facility] and 2nd to [facility]           Patient will benefit from skilled therapeutic intervention in order to improve the following deficits and impairments:  Pain, Postural dysfunction, Impaired UE functional use, Increased fascial restricitons, Decreased strength, Decreased knowledge of precautions, Decreased range of motion, Decreased scar mobility, Increased edema  Visit Diagnosis: Postmastectomy lymphedema  Stiffness of right shoulder, not elsewhere classified  Acute pain of right shoulder  Disorder of the skin and subcutaneous tissue related to radiation, unspecified  Abnormal posture     Problem  List Patient Active Problem List   Diagnosis Date Noted   Closed nondisplaced zone III fracture of sacrum [facility]) 02/18/2017    Milagros Loll [facility] 02/07/2020, 4:00 PM  [facility] [address] [place], [place], [address] Phone: [phone]   Fax:  [phone]  Name: MIKKI ZIFF MRN: [id number] Date of Birth: 10-07-1970  Leonette Most, PT 02/07/20 4:00 PM

## 2020-02-21 ENCOUNTER — Ambulatory Visit: Payer: BLUE CROSS/BLUE SHIELD

## 2020-02-21 ENCOUNTER — Other Ambulatory Visit: Payer: Self-pay

## 2020-02-21 DIAGNOSIS — I972 Postmastectomy lymphedema syndrome: Secondary | ICD-10-CM

## 2020-02-21 DIAGNOSIS — R293 Abnormal posture: Secondary | ICD-10-CM

## 2020-02-21 DIAGNOSIS — M25611 Stiffness of right shoulder, not elsewhere classified: Secondary | ICD-10-CM

## 2020-02-21 DIAGNOSIS — M25511 Pain in right shoulder: Secondary | ICD-10-CM

## 2020-02-21 DIAGNOSIS — L599 Disorder of the skin and subcutaneous tissue related to radiation, unspecified: Secondary | ICD-10-CM

## 2020-02-21 NOTE — Therapy (Signed)
[facility] [address] [place], [place], [address] Phone: [phone]   Fax:  [phone]  Physical Therapy Treatment  Patient Details  Name: Gloria Bowman MRN: [id number] Date of Birth: 1970-08-11 Referring Provider (PT): Thompson Caul   Encounter Date: 02/21/2020   PT End of Session - 02/21/20 1507    Visit Number 2    Number of Visits 9    Date for PT Re-Evaluation 03/06/20    PT Start Time 1406    PT Stop Time 1507    PT Time Calculation (min) 61 min    Activity Tolerance Patient tolerated treatment well    Behavior During Therapy [facility] for tasks assessed/performed           Past Medical History:  Diagnosis Date  . RA (rheumatoid arthritis) (HCC)     Past Surgical History:  Procedure Laterality Date  . ABDOMINAL HYSTERECTOMY    . MASTECTOMY      There were no vitals filed for this visit.   Subjective Assessment - 02/21/20 1409    Subjective I found my pump but might need some instruction as I haven't used it for awhile. I also found my sleeve and can bring that next time. Today my shoulder just feels achy into my upper arm. And my axilla just feels real tight.    Pertinent History R breast cancer, invasive lobular carcinoma stage 2, mets to 1 to 3 nodes, R lumpectomy and SLNB 01/30/2016, R mastectomy 03/12/16, completed chemo and radiation, lat flap reconstruction, RA    Patient Stated Goals to get more strength in the arm and to have more mobility    Currently in Pain? No/denies   just very achy in Rt upper arm                      Outpatient Rehab from 02/07/2020 [facility]  Lymphedema Life Impact Scale Total Score 50 %            [facility] Adult PT Treatment/Exercise - 02/21/20 0001      Manual Therapy   Manual Therapy Myofascial release;Manual Lymphatic Drainage (MLD);Passive ROM;Soft tissue mobilization    Soft tissue mobilization To Rt anterior and superior  shoulder/upper trap area during P/ROM where pt c/o discomfort at end flexion and abduction    Myofascial Release To Rt axilla during P/ROM    Manual Lymphatic Drainage (MLD) In Supine: Short neck, superficial and deep abdominals, Rt inguinal and Lt axilla nodes, Rt axill-inguinal and anterior inter-axillary anastomosis, then focused on Rt lateral trunk and Rt UE working from proximal to distal then retracing all steps beginning to instruct pt in this throughout    Passive ROM In Supine to Rt shoulder into flexion, abduction and D2 to pts tolerance                       PT Long Term Goals - 02/07/20 1555      PT LONG TERM GOAL #1   Title Pt will receive appropriate compression garments for long term management of lymphedema.    Time 4    Period Weeks    Status New    Target Date 03/06/20      PT LONG TERM GOAL #2   Title Pt will demonstrate 165 degrees of R shoulder abduction to allow her to reach out to the sides.    Baseline 130    Time 4    Period  Weeks    Status New    Target Date 03/06/20      PT LONG TERM GOAL #3   Title Pt will be indepdent in self MLD for long term management of lymphedema.    Time 4    Period Weeks    Status New    Target Date 03/06/20      PT LONG TERM GOAL #4   Title Pt will be independent in a home exercise program for continued strengthening and stretching.    Time 4    Period Weeks    Status New    Target Date 03/06/20      PT LONG TERM GOAL #5   Title Pt will report a 75% improvement in pain and tightness in R upper quadrant to allow improved comfort.    Time 4    Period Weeks    Status New    Target Date 03/06/20                 Plan - 02/21/20 2058    Clinical Impression Statement First session of treatment. Focused on manual therapy working towards decreasing myofascial tightness at axilla and area of radiation fibrosis. Also worked to progress her P/ROM and included manual lymph drainage to Rt lateral trunk and UE,  while beginning to educate her on principles of MLD and basics of anatomy of lymphatic system. Issued phone numbers for [facility] and [facility] for pt to call and see if her order has been faxed there from doctor yet. She is to call her doctor to request this if not. She found her Flexitouch and plans a trial of this as she hasn't used it in awhile. If she has trouble or feels it needs adjusting she can call us to request [place] getting in touch with [employer]. Pt seemed very encouraged with todays session and reports feeling much looser in Rt upper quadrant by end of session.    Personal Factors and Comorbidities Comorbidity 1;Time since onset of injury/illness/exacerbation    Comorbidities RA    Examination-Activity Limitations Bathing;Reach Overhead;Sleep    Examination-Participation Restrictions Community Activity;Cleaning;Occupation    Stability/Clinical Decision Making Stable/Uncomplicated    Rehab Potential Good    PT Frequency 2x / week    PT Duration 4 weeks    PT Treatment/Interventions ADLs/Self Care Home Management;Therapeutic exercise;Patient/family education;Manual techniques;Manual lymph drainage;Orthotic Fit/Training;Compression bandaging;Taping;Passive range of motion;Joint Manipulations;Scar mobilization    PT Next Visit Plan How is Flexitouch going/need any further assist from [place] for this? Get measured for garments yet? educate on lymphedema, cont PROM to R shoulder, STM to R upper quadrant, instruct in self MLD issuing handout next    Consulted and Agree with Plan of Care Patient           Patient will benefit from skilled therapeutic intervention in order to improve the following deficits and impairments:  Pain, Postural dysfunction, Impaired UE functional use, Increased fascial restricitons, Decreased strength, Decreased knowledge of precautions, Decreased range of motion, Decreased scar mobility, Increased edema  Visit Diagnosis: Postmastectomy  lymphedema  Stiffness of right shoulder, not elsewhere classified  Acute pain of right shoulder  Disorder of the skin and subcutaneous tissue related to radiation, unspecified  Abnormal posture     Problem List Patient Active Problem List   Diagnosis Date Noted  . Closed nondisplaced zone III fracture of sacrum (HCC) 02/18/2017    Hermenia Bers, PTA 02/21/2020, 9:05 PM  [facility]  [address] [place], [place], [address] Phone: [phone]   Fax:  [phone]  Name: Gloria Bowman MRN: [id number] Date of Birth: Feb 28, 1971

## 2020-02-23 ENCOUNTER — Encounter: Payer: BLUE CROSS/BLUE SHIELD | Admitting: Physical Therapy

## 2020-02-29 ENCOUNTER — Ambulatory Visit: Payer: BLUE CROSS/BLUE SHIELD | Admitting: Physical Therapy

## 2020-02-29 ENCOUNTER — Other Ambulatory Visit: Payer: Self-pay

## 2020-02-29 ENCOUNTER — Encounter: Payer: Self-pay | Admitting: Physical Therapy

## 2020-02-29 DIAGNOSIS — L599 Disorder of the skin and subcutaneous tissue related to radiation, unspecified: Secondary | ICD-10-CM

## 2020-02-29 DIAGNOSIS — I972 Postmastectomy lymphedema syndrome: Secondary | ICD-10-CM

## 2020-02-29 DIAGNOSIS — M25611 Stiffness of right shoulder, not elsewhere classified: Secondary | ICD-10-CM

## 2020-02-29 DIAGNOSIS — M25511 Pain in right shoulder: Secondary | ICD-10-CM

## 2020-02-29 NOTE — Therapy (Signed)
[facility] [address] [place], [place], [address] Phone: [phone]   Fax:  [phone]  Physical Therapy Treatment  Patient Details  Name: Gloria Bowman MRN: [id number] Date of Birth: 1970-11-08 Referring Provider (PT): Thompson Caul   Encounter Date: 02/29/2020   PT End of Session - 02/29/20 1604    Visit Number 3    Number of Visits 9    Date for PT Re-Evaluation 03/06/20    PT Start Time 1507   pt arrived late   PT Stop Time 1558    PT Time Calculation (min) 51 min    Activity Tolerance Patient tolerated treatment well    Behavior During Therapy [facility] for tasks assessed/performed           Past Medical History:  Diagnosis Date  . RA (rheumatoid arthritis) (HCC)     Past Surgical History:  Procedure Laterality Date  . ABDOMINAL HYSTERECTOMY    . MASTECTOMY      There were no vitals filed for this visit.   Subjective Assessment - 02/29/20 1507    Subjective I feel a little fluid under my arm. I remember how to use my compression pump. I have not been using it consistently but I have been using the pump.    Pertinent History R breast cancer, invasive lobular carcinoma stage 2, mets to 1 to 3 nodes, R lumpectomy and SLNB 01/30/2016, R mastectomy 03/12/16, completed chemo and radiation, lat flap reconstruction, RA    Patient Stated Goals to get more strength in the arm and to have more mobility    Currently in Pain? No/denies                       Outpatient [facility] from [facility]/07/[facility]  Lymphedema Life Impact Scale Total Score 50 %            [facility] Adult PT Treatment/Exercise - 02/29/20 0001      Manual Therapy   Soft tissue mobilization To R pec and R lateral trunk in area of tightness and discomfort    Manual Lymphatic Drainage (MLD) In Supine: Short neck, superficial and deep abdominals, Rt inguinal and Lt axilla nodes, Rt axill-inguinal and anterior  inter-axillary anastomosis, then focused on Rt lateral trunk and Rt UE working from proximal to distal then retracing all steps beginning to instruct pt in this throughout    Passive ROM In supine to R shoulder in to flexion and abduction with distraction to avoid pain                       PT Long Term Goals - 02/07/20 1555      PT LONG TERM GOAL #1   Title Pt will receive appropriate compression garments for long term management of lymphedema.    Time 4    Period Weeks    Status New    Target Date 03/06/20      PT LONG TERM GOAL #2   Title Pt will demonstrate 165 degrees of R shoulder abduction to allow her to reach out to the sides.    Baseline 130    Time 4    Period Weeks    Status New    Target Date 03/06/20      PT LONG TERM GOAL #3   Title Pt will be indepdent in self MLD for long term management of lymphedema.    Time 4  Period Weeks    Status New    Target Date 03/06/20      PT LONG TERM GOAL #4   Title Pt will be independent in a home exercise program for continued strengthening and stretching.    Time 4    Period Weeks    Status New    Target Date 03/06/20      PT LONG TERM GOAL #5   Title Pt will report a 75% improvement in pain and tightness in R upper quadrant to allow improved comfort.    Time 4    Period Weeks    Status New    Target Date 03/06/20                 Plan - 02/29/20 1607    Clinical Impression Statement Continued to work on decreasing tightness at R axilla through soft tissue mobilization and PROM. Continued with MLD to RUE. Pt remembered how to use her FlexiTouch and has started using it again but not on a consistent basis. Pt still with increased scar tissue causing tightness in axilla.    PT Frequency 2x / week    PT Duration 4 weeks    PT Treatment/Interventions ADLs/Self Care Home Management;Therapeutic exercise;Patient/family education;Manual techniques;Manual lymph drainage;Orthotic Fit/Training;Compression  bandaging;Taping;Passive range of motion;Joint Manipulations;Scar mobilization    PT Next Visit Plan Give pt copy of unsigned script (it is in folder up front under items that have been faxed), How is Flexitouch going/need any further assist from [place] for this? Get measured for garments yet? educate on lymphedema, cont PROM to R shoulder, STM to R upper quadrant, instruct in self MLD issuing handout next    Consulted and Agree with Plan of Care Patient           Patient will benefit from skilled therapeutic intervention in order to improve the following deficits and impairments:  Pain, Postural dysfunction, Impaired UE functional use, Increased fascial restricitons, Decreased strength, Decreased knowledge of precautions, Decreased range of motion, Decreased scar mobility, Increased edema  Visit Diagnosis: Postmastectomy lymphedema  Stiffness of right shoulder, not elsewhere classified  Acute pain of right shoulder  Disorder of the skin and subcutaneous tissue related to radiation, unspecified     Problem List Patient Active Problem List   Diagnosis Date Noted  . Closed nondisplaced zone III fracture of sacrum [facility]) 02/18/2017    Milagros Loll [facility] 02/29/2020, 4:10 PM  [facility] [address] [place], [place], [address] Phone: [phone]   Fax:  [phone]  Name: Gloria Bowman MRN: [id number] Date of Birth: [birth year]  Leonette Most, PT 02/29/20 4:10 PM

## 2020-03-02 ENCOUNTER — Encounter: Payer: Self-pay | Admitting: Physical Therapy

## 2020-03-02 ENCOUNTER — Ambulatory Visit: Payer: BLUE CROSS/BLUE SHIELD | Attending: Plastic Surgery | Admitting: Physical Therapy

## 2020-03-02 ENCOUNTER — Other Ambulatory Visit: Payer: Self-pay

## 2020-03-02 DIAGNOSIS — L599 Disorder of the skin and subcutaneous tissue related to radiation, unspecified: Secondary | ICD-10-CM | POA: Insufficient documentation

## 2020-03-02 DIAGNOSIS — M25611 Stiffness of right shoulder, not elsewhere classified: Secondary | ICD-10-CM | POA: Insufficient documentation

## 2020-03-02 DIAGNOSIS — R293 Abnormal posture: Secondary | ICD-10-CM | POA: Insufficient documentation

## 2020-03-02 DIAGNOSIS — M25511 Pain in right shoulder: Secondary | ICD-10-CM | POA: Diagnosis present

## 2020-03-02 DIAGNOSIS — I972 Postmastectomy lymphedema syndrome: Secondary | ICD-10-CM | POA: Diagnosis present

## 2020-03-02 NOTE — Therapy (Signed)
[facility] [address] [place], [place], [address] Phone: [phone]   Fax:  [phone]  Physical Therapy Treatment  Patient Details  Name: Gloria Bowman MRN: [id number] Date of Birth: Feb 11, 1971 Referring Provider (PT): Thompson Caul   Encounter Date: 03/02/2020   PT End of Session - 03/02/20 1557    Visit Number 4    Number of Visits 9    Date for PT Re-Evaluation 03/06/20    PT Start Time 1505    PT Stop Time 1556    PT Time Calculation (min) 51 min    Activity Tolerance Patient tolerated treatment well    Behavior During Therapy [facility] for tasks assessed/performed           Past Medical History:  Diagnosis Date  . RA (rheumatoid arthritis) (HCC)     Past Surgical History:  Procedure Laterality Date  . ABDOMINAL HYSTERECTOMY    . MASTECTOMY      There were no vitals filed for this visit.   Subjective Assessment - 03/02/20 1506    Subjective My arm feels pretty good.    Pertinent History R breast cancer, invasive lobular carcinoma stage 2, mets to 1 to 3 nodes, R lumpectomy and SLNB 01/30/2016, R mastectomy 03/12/16, completed chemo and radiation, lat flap reconstruction, RA    Patient Stated Goals to get more strength in the arm and to have more mobility    Currently in Pain? No/denies                       Outpatient [facility] from [facility]/07/[facility]  Lymphedema Life Impact Scale Total Score 50 %            [facility] Adult PT Treatment/Exercise - 03/02/20 0001      Shoulder Exercises: Supine   Horizontal ABduction Strengthening;Both;10 reps;Theraband   pt returned therapist demo   Theraband Level (Shoulder Horizontal ABduction) Level 1 (Yellow)    External Rotation Strengthening;Both;10 reps;Theraband   pt returned therapist demo   Theraband Level (Shoulder External Rotation) Level 1 (Yellow)    Flexion Strengthening;10 reps;Theraband   pt returned therapist  demo, narrow and wide grip   Theraband Level (Shoulder Flexion) Level 1 (Yellow)    Diagonals Strengthening;Both;10 reps;Theraband   pt returned therapist demo   Theraband Level (Shoulder Diagonals) Level 1 (Yellow)      Manual Therapy   Soft tissue mobilization To R pec with R shoulder in abduction to help decrease tightness    Manual Lymphatic Drainage (MLD) In Supine: Short neck, superficial and deep abdominals, Rt inguinal and Lt axilla nodes, Rt axill-inguinal and anterior inter-axillary anastomosis, then focused on Rt lateral trunk and Rt UE working from proximal to distal then retracing all steps beginning to instruct pt in this throughout                       PT Long Term Goals - 02/07/20 1555      PT LONG TERM GOAL #1   Title Pt will receive appropriate compression garments for long term management of lymphedema.    Time 4    Period Weeks    Status New    Target Date 03/06/20      PT LONG TERM GOAL #2   Title Pt will demonstrate 165 degrees of R shoulder abduction to allow her to reach out to the sides.    Baseline 130    Time 4  Period Weeks    Status New    Target Date 03/06/20      PT LONG TERM GOAL #3   Title Pt will be indepdent in self MLD for long term management of lymphedema.    Time 4    Period Weeks    Status New    Target Date 03/06/20      PT LONG TERM GOAL #4   Title Pt will be independent in a home exercise program for continued strengthening and stretching.    Time 4    Period Weeks    Status New    Target Date 03/06/20      PT LONG TERM GOAL #5   Title Pt will report a 75% improvement in pain and tightness in R upper quadrant to allow improved comfort.    Time 4    Period Weeks    Status New    Target Date 03/06/20                 Plan - 03/02/20 1557    Clinical Impression Statement Instructed pt in supine scapular series with yellow theraband today. Pt did not have any shoulder pain with these. Issued these as part  of pt's HEP. Continued with MLD to RUE as well as manual therapy to R pec to help decrease tightness especially at anterior axilla.    PT Frequency 2x / week    PT Duration 4 weeks    PT Treatment/Interventions ADLs/Self Care Home Management;Therapeutic exercise;Patient/family education;Manual techniques;Manual lymph drainage;Orthotic Fit/Training;Compression bandaging;Taping;Passive range of motion;Joint Manipulations;Scar mobilization    PT Next Visit Plan see if pt got script signed or called to find out correct fax number- can resend (copy is in folder at front that has faxed items), remeasure ROM, continued MFR to R pec and ROM to R shoulder    PT Home Exercise Plan supine scap series    Consulted and Agree with Plan of Care Patient           Patient will benefit from skilled therapeutic intervention in order to improve the following deficits and impairments:  Pain, Postural dysfunction, Impaired UE functional use, Increased fascial restricitons, Decreased strength, Decreased knowledge of precautions, Decreased range of motion, Decreased scar mobility, Increased edema  Visit Diagnosis: Stiffness of right shoulder, not elsewhere classified  Acute pain of right shoulder  Disorder of the skin and subcutaneous tissue related to radiation, unspecified  Postmastectomy lymphedema     Problem List Patient Active Problem List   Diagnosis Date Noted  . Closed nondisplaced zone III fracture of sacrum [facility]) 02/18/2017    Milagros Loll [facility] 03/02/2020, 4:00 PM  [facility] [address] [place], [place], [address] Phone: [phone]   Fax:  [phone]  Name: Gloria Bowman MRN: [id number] Date of Birth: Nov 24, 1970  Leonette Most, PT 03/02/20 4:00 PM

## 2020-03-02 NOTE — Patient Instructions (Signed)
Over Head Pull: Narrow and Wide Grip   Cancer Rehab [phone] ? ? ?On back, knees bent, feet flat, band across thighs, elbows straight but relaxed. Pull hands apart (start). Keeping elbows straight, bring arms up and over head, hands toward floor. Keep pull steady on band. Hold momentarily. Return slowly, keeping pull steady, back to start. Then do same with a wider grip on the band (past shoulder width) ?Repeat _10__ times. Band color __yellow____  ? ?Side Pull: Double Arm ? ? ?On back, knees bent, feet flat. Arms perpendicular to body, shoulder level, elbows straight but relaxed. Pull arms out to sides, elbows straight. Resistance band comes across collarbones, hands toward floor. Hold momentarily. Slowly return to starting position. Repeat _10__ times. Band color _yellow____  ? ?Sword ? ? ?On back, knees bent, feet flat, left hand on left hip, right hand above left. Pull right arm DIAGONALLY (hip to shoulder) across chest. Bring right arm along head toward floor. Hold momentarily. Slowly return to starting position. Thumb is pointed down when by hip and rotates upwards when by head. ?Repeat _10__ times. Do with left arm. Band color _yellow_____  ? ?Shoulder Rotation: Double Arm ? ? ?On back, knees bent, feet flat, elbows tucked at sides, bent 90?, hands palms up. Pull hands apart and down toward floor, keeping elbows near sides. Hold momentarily. Slowly return to starting position. ?Repeat _10__ times. Band color __yellow____  ? ? ?

## 2020-03-07 ENCOUNTER — Encounter: Payer: BLUE CROSS/BLUE SHIELD | Admitting: Rehabilitation

## 2020-03-09 ENCOUNTER — Encounter: Payer: BLUE CROSS/BLUE SHIELD | Admitting: Rehabilitation

## 2020-03-13 ENCOUNTER — Encounter: Payer: BLUE CROSS/BLUE SHIELD | Admitting: Physical Therapy

## 2020-03-15 ENCOUNTER — Encounter: Payer: BLUE CROSS/BLUE SHIELD | Admitting: Physical Therapy

## 2020-03-20 ENCOUNTER — Ambulatory Visit: Payer: BLUE CROSS/BLUE SHIELD

## 2020-03-20 ENCOUNTER — Other Ambulatory Visit: Payer: Self-pay

## 2020-03-20 DIAGNOSIS — M25611 Stiffness of right shoulder, not elsewhere classified: Secondary | ICD-10-CM | POA: Diagnosis not present

## 2020-03-20 DIAGNOSIS — L599 Disorder of the skin and subcutaneous tissue related to radiation, unspecified: Secondary | ICD-10-CM

## 2020-03-20 DIAGNOSIS — R293 Abnormal posture: Secondary | ICD-10-CM

## 2020-03-20 DIAGNOSIS — M25511 Pain in right shoulder: Secondary | ICD-10-CM

## 2020-03-20 DIAGNOSIS — I972 Postmastectomy lymphedema syndrome: Secondary | ICD-10-CM

## 2020-03-20 NOTE — Therapy (Addendum)
[place], [place], [address] Phone: [phone]   Fax:  [phone]  Physical Therapy Treatment  Patient Details  Name: Gloria Bowman MRN: [id number] Date of Birth: 1970-12-06 Referring Provider (PT): Morley Kos   Encounter Date: 03/20/2020   PT End of Session - 03/20/20 5038    Visit Number 5    Number of Visits 9    Date for PT Re-Evaluation 03/06/20    PT Start Time 1510    PT Stop Time 8828    PT Time Calculation (min) 54 min    Activity Tolerance Patient tolerated treatment well    Behavior During Therapy [facility] for tasks assessed/performed           Past Medical History:  Diagnosis Date  . RA (rheumatoid arthritis) ([place])     Past Surgical History:  Procedure Laterality Date  . ABDOMINAL HYSTERECTOMY    . MASTECTOMY      There were no vitals filed for this visit.   Subjective Assessment - 03/20/20 1529    Subjective I missed a few appointments due to not feeling well after vaccine and then my grandbaby was born. But I'm feeling alot better. My swelling is better in my arm and trunk and my tightness is imporved at my axilla.    Pertinent History R breast cancer, invasive lobular carcinoma stage 2, mets to 1 to 3 nodes, R lumpectomy and SLNB 01/30/2016, R mastectomy 03/12/16, completed chemo and radiation, lat flap reconstruction, RA    Patient Stated Goals to get more strength in the arm and to have more mobility    Currently in Pain? No/denies                       Outpatient Rehab from 02/07/2020 [facility]  Lymphedema Life Impact Scale Total Score 50 %            [facility] Adult PT Treatment/Exercise - 03/20/20 0001      Manual Therapy   Soft tissue mobilization To R pec with R shoulder in abduction to help decrease tightness, then to Rt medial scapular border and Rt upper trap in Lt S/L all done with coconut oil    Manual Lymphatic  Drainage (MLD) In Supine: Short neck, 5 diaphragmatic breaths, Rt inguinal and Lt axilla nodes, Rt axill-inguinal and anterior inter-axillary anastomosis, then focused on Rt lateral trunk and Rt UE working from proximal to distal then retracing all steps reviewing with pt throughout    Passive ROM In supine to R shoulder in to flexion and abduction with distraction to avoid discomfort                       PT Long Term Goals - 03/20/20 1524      PT LONG TERM GOAL #1   Title Pt will receive appropriate compression garments for long term management of lymphedema.    Baseline Pt has compression bras now and wears her old compression sleeve prn, will look into getting a new one once we receive order sign back from MD-03/20/20    Status Achieved      PT LONG TERM GOAL #2   Title Pt will demonstrate 165 degrees of R shoulder abduction to allow her to reach out to the sides.    Baseline 130; 170 degrees - 03/20/20    Status Achieved      PT LONG TERM GOAL #3  Title Pt will be indepdent in self MLD for long term management of lymphedema.    Baseline Reviewed this with pt today and independent-03/20/20      PT LONG TERM GOAL #4   Title Pt will be independent in a home exercise program for continued strengthening and stretching.    Baseline Pt independent with HEP and progressed with red theraband today-03/20/20    Status Achieved      PT LONG TERM GOAL #5   Title Pt will report a 75% improvement in pain and tightness in R upper quadrant to allow improved comfort.    Baseline Pt reports at least 75% improvement at this time-03/20/20    Status Achieved                 Plan - 03/20/20 1606    Clinical Impression Statement PT has met all goals and reports feeling ready for D/C at this time. Issued red theraband for her to progress with HEP. Will refax script to MD for compression sleeve and, per pt request, fax to [facility] for her to get measured for a new compresion  sleeve.    Personal Factors and Comorbidities Comorbidity 1;Time since onset of injury/illness/exacerbation    Comorbidities RA    Examination-Activity Limitations Bathing;Reach Overhead;Sleep    Examination-Participation Restrictions Community Activity;Cleaning;Occupation    Stability/Clinical Decision Making Stable/Uncomplicated    Rehab Potential Good    PT Frequency 2x / week    PT Duration 4 weeks    PT Treatment/Interventions ADLs/Self Care Home Management;Therapeutic exercise;Patient/family education;Manual techniques;Manual lymph drainage;Orthotic Fit/Training;Compression bandaging;Taping;Passive range of motion;Joint Manipulations;Scar mobilization    PT Next Visit Plan D/C this visit.    PT Home Exercise Plan supine scap series    Consulted and Agree with Plan of Care Patient           Patient will benefit from skilled therapeutic intervention in order to improve the following deficits and impairments:  Pain, Postural dysfunction, Impaired UE functional use, Increased fascial restricitons, Decreased strength, Decreased knowledge of precautions, Decreased range of motion, Decreased scar mobility, Increased edema  Visit Diagnosis: Stiffness of right shoulder, not elsewhere classified  Acute pain of right shoulder  Disorder of the skin and subcutaneous tissue related to radiation, unspecified  Postmastectomy lymphedema  Abnormal posture     Problem List Patient Active Problem List   Diagnosis Date Noted  . Closed nondisplaced zone III fracture of sacrum (HCC) 02/18/2017    Gloria Bowman, PTA 03/20/2020, 4:09 PM  [place], [place], [address] Phone: [phone]   Fax:  [phone]  Name: Gloria Bowman MRN: [id number] Date of Birth: 1971/01/02  PHYSICAL THERAPY DISCHARGE SUMMARY  Visits from Start of Care: 5  Current functional level related to goals / functional  outcomes: All goals met   Remaining deficits: None   Education / Equipment: HEP, self MLD  Plan: Patient agrees to discharge.  Patient goals were met. Patient is being discharged due to meeting the stated rehab goals.  ?????   Gloria Bowman [place], [place] 03/21/20 1:59 PM

## 2020-03-22 ENCOUNTER — Ambulatory Visit: Payer: BLUE CROSS/BLUE SHIELD | Admitting: Physical Therapy

## 2020-04-06 ENCOUNTER — Other Ambulatory Visit: Payer: Self-pay

## 2020-04-06 ENCOUNTER — Emergency Department (HOSPITAL_COMMUNITY): Payer: BLUE CROSS/BLUE SHIELD

## 2020-04-06 ENCOUNTER — Emergency Department (HOSPITAL_COMMUNITY)
Admission: EM | Admit: 2020-04-06 | Discharge: 2020-04-07 | Disposition: A | Discharge status: Home / Self Care | Payer: BLUE CROSS/BLUE SHIELD | Attending: Emergency Medicine | Admitting: Emergency Medicine

## 2020-04-06 ENCOUNTER — Encounter (HOSPITAL_COMMUNITY): Payer: Self-pay

## 2020-04-06 DIAGNOSIS — R1032 Left lower quadrant pain: Secondary | ICD-10-CM | POA: Diagnosis present

## 2020-04-06 DIAGNOSIS — N200 Calculus of kidney: Secondary | ICD-10-CM | POA: Insufficient documentation

## 2020-04-06 LAB — COMPREHENSIVE METABOLIC PANEL
ALT: 17 U/L (ref 0–44)
AST: 22 U/L (ref 15–41)
Albumin: 4.5 g/dL (ref 3.5–5.0)
Alkaline Phosphatase: 51 U/L (ref 38–126)
Anion gap: 11 (ref 5–15)
BUN: 19 mg/dL (ref 6–20)
CO2: 26 mmol/L (ref 22–32)
Calcium: 9.6 mg/dL (ref 8.9–10.3)
Chloride: 102 mmol/L (ref 98–111)
Creatinine, Ser: 1.11 mg/dL — ABNORMAL HIGH (ref 0.44–1.00)
GFR calc non Af Amer: 58 mL/min — ABNORMAL LOW (ref >60)
Glucose, Bld: 110 mg/dL — ABNORMAL HIGH (ref 70–99)
Potassium: 3.9 mmol/L (ref 3.5–5.1)
Sodium: 139 mmol/L (ref 135–145)
Total Bilirubin: 0.6 mg/dL (ref 0.3–1.2)
Total Protein: 7.8 g/dL (ref 6.5–8.1)

## 2020-04-06 LAB — URINALYSIS, ROUTINE W REFLEX MICROSCOPIC
Bacteria, UA: NONE SEEN
Bilirubin Urine: NEGATIVE
Glucose, UA: NEGATIVE mg/dL
Ketones, ur: 5 mg/dL — AB
Nitrite: NEGATIVE
Protein, ur: 30 mg/dL — AB
RBC / HPF: >50 RBC/hpf — ABNORMAL HIGH (ref 0–5)
Specific Gravity, Urine: 1.018 (ref 1.005–1.030)
pH: 7 (ref 5.0–8.0)

## 2020-04-06 LAB — CBC
HCT: 40.2 % (ref 36.0–46.0)
Hemoglobin: 13.1 g/dL (ref 12.0–15.0)
MCH: 29.6 pg (ref 26.0–34.0)
MCHC: 32.6 g/dL (ref 30.0–36.0)
MCV: 90.7 fL (ref 80.0–100.0)
Platelets: 285 10*3/uL (ref 150–400)
RBC: 4.43 MIL/uL (ref 3.87–5.11)
RDW: 12.5 % (ref 11.5–15.5)
WBC: 8.9 10*3/uL (ref 4.0–10.5)
nRBC: 0 % (ref 0.0–0.2)

## 2020-04-06 LAB — LIPASE, BLOOD: Lipase: 33 U/L (ref 11–51)

## 2020-04-06 MED ORDER — KETOROLAC TROMETHAMINE 15 MG/ML IJ SOLN
15.0000 mg | Freq: Once | INTRAMUSCULAR | Status: AC
  Administered 2020-04-06: 15 mg via INTRAVENOUS
  Filled 2020-04-06: qty 1

## 2020-04-06 MED ORDER — SODIUM CHLORIDE 0.9 % IV BOLUS
1000.0000 mL | Freq: Once | INTRAVENOUS | Status: AC
  Administered 2020-04-06: 1000 mL via INTRAVENOUS

## 2020-04-06 MED ORDER — ONDANSETRON HCL 4 MG/2ML IJ SOLN
4.0000 mg | Freq: Once | INTRAMUSCULAR | Status: AC
  Administered 2020-04-06: 4 mg via INTRAVENOUS

## 2020-04-06 MED ORDER — HYDROMORPHONE HCL 1 MG/ML IJ SOLN
0.5000 mg | Freq: Once | INTRAMUSCULAR | Status: AC
  Administered 2020-04-06: 0.5 mg via INTRAVENOUS
  Filled 2020-04-06: qty 1

## 2020-04-06 NOTE — ED Provider Notes (Signed)
[facility]EMER[facility]ENCY DEPT [facility] Emergency Department Provider Note MRN:  [id number]  Arrival date & time: 04/07/20     Chief Complaint   Flank Pain   History of Present Illness   Gloria Bowman is a 49 y.o. year-old female with a history of RA presenting to the ED with chief complaint of flank pain.  Location: Left flank with radiation into the left lower quadrant Duration: 5 hours Onset: Sudden Timing: Constant Description: Sharp Severity: Severe Exacerbating/Alleviating Factors: None Associated Symptoms: Nausea Pertinent Negatives: Denies fever, no dysuria, no chest pain or shortness of breath   Review of Systems  A complete 10 system review of systems was obtained and all systems are negative except as noted in the HPI and PMH.   Patient's Health History    Past Medical History:  Diagnosis Date  . RA (rheumatoid arthritis) (HCC)     Past Surgical History:  Procedure Laterality Date  . ABDOMINAL HYSTERECTOMY    . MASTECTOMY      No family history on file.  Social History   Socioeconomic History  . Marital status: Single    Spouse name: Not on file  . Number of children: Not on file  . Years of education: Not on file  . Highest education level: Not on file  Occupational History  . Not on file  Tobacco Use  . Smoking status: Never Smoker  . Smokeless tobacco: Never Used  Substance and Sexual Activity  . Alcohol use: No  . Drug use: No  . Sexual activity: Not on file  Other Topics Concern  . Not on file  Social History Narrative  . Not on file   Social Determinants of Health   Financial Resource Strain:   . Difficulty of Paying Living Expenses: Not on file  Food Insecurity:   . Worried About Programme researcher, broadcasting/film/video in the Last Year: Not on file  . Ran Out of Food in the Last Year: Not on file  Transportation Needs:   . Lack of Transportation (Medical): Not on file  . Lack of Transportation (Non-Medical): Not on file  Physical Activity:   . Days  of Exercise per Week: Not on file  . Minutes of Exercise per Session: Not on file  Stress:   . Feeling of Stress : Not on file  Social Connections:   . Frequency of Communication with Friends and Family: Not on file  . Frequency of Social Gatherings with Friends and Family: Not on file  . Attends Religious Services: Not on file  . Active Member of Clubs or Organizations: Not on file  . Attends Banker Meetings: Not on file  . Marital Status: Not on file  Intimate Partner Violence:   . Fear of Current or Ex-Partner: Not on file  . Emotionally Abused: Not on file  . Physically Abused: Not on file  . Sexually Abused: Not on file     Physical Exam   Vitals:   04/06/20 2333 04/07/20 0033  BP: (!) 140/114 118/90  Pulse: 89 88  Resp: 20 18  Temp: 97.6 F (36.4 C)   SpO2: 100% 100%    CONSTITUTIONAL: Well-appearing, in moderate distress due to pain NEURO:  Alert and oriented x 3, no focal deficits EYES:  eyes equal and reactive ENT/NECK:  no LAD, no JVD CARDIO: Regular rate, well-perfused, normal S1 and S2 PULM:  CTAB no wheezing or rhonchi GI/GU:  normal bowel sounds, non-distended, non-tender, mild left CVA tenderness MSK/SPINE:  No  gross deformities, no edema SKIN:  no rash, atraumatic PSYCH:  Appropriate speech and behavior  *Additional and/or pertinent findings included in MDM below  Diagnostic and Interventional Summary    EKG Interpretation  Date/Time:    Ventricular Rate:    PR Interval:    QRS Duration:   QT Interval:    QTC Calculation:   R Axis:     Text Interpretation:        Labs Reviewed  COMPREHENSIVE METABOLIC PANEL - Abnormal; Notable for the following components:      Result Value   Glucose, Bld 110 (*)    Creatinine, Ser 1.11 (*)    GFR calc non Af Amer 58 (*)    All other components within normal limits  URINALYSIS, ROUTINE W REFLEX MICROSCOPIC - Abnormal; Notable for the following components:   APPearance HAZY (*)    Hgb  urine dipstick SMALL (*)    Ketones, ur 5 (*)    Protein, ur 30 (*)    Leukocytes,Ua MODERATE (*)    RBC / HPF >50 (*)    All other components within normal limits  LIPASE, BLOOD  CBC    CT RENAL STONE STUDY  Final Result      Medications  ketorolac (TORADOL) 15 MG/ML injection 15 mg (15 mg Intravenous Given 04/06/20 2346)  HYDROmorphone (DILAUDID) injection 0.5 mg (0.5 mg Intravenous Given 04/06/20 2346)  sodium chloride 0.9 % bolus 1,000 mL (1,000 mLs Intravenous New Bag/Given 04/06/20 2345)  ondansetron (ZOFRAN) injection 4 mg (4 mg Intravenous Given 04/06/20 2346)     Procedures  /  Critical Care Procedures  ED Course and Medical Decision Making  I have reviewed the triage vital signs, the nursing notes, and pertinent available records from the EMR.  Listed above are laboratory and imaging tests that I personally ordered, reviewed, and interpreted and then considered in my medical decision making (see below for details).  Suspect stone, awaiting CT.     CT reveals likely recently passed kidney stone into the bladder.  Labs are reassuring without evidence of UTI, mild increased creatinine but nothing significant.  Patient feels a lot better, continues to have normal vital signs, appropriate for discharge with urology follow-up.  Elmer Sow. Pilar Plate, MD [facility] Emergency [facility] [email or web address]  Final Clinical Impressions(s) / ED Diagnoses     ICD-10-CM   1. Kidney stone  N20.0     ED Discharge Orders    None       Discharge Instructions Discussed with and Provided to Patient:     Discharge Instructions     You were evaluated in the Emergency Department and after careful evaluation, we did not find any emergent condition requiring admission or further testing in the hospital.  Your exam/testing today was overall reassuring.  Your symptoms seem to be due to a kidney stone, which seems to have already passed into your bladder.  We  recommend straining your urine and following up with urology.  They may be able to test the kidney stone and provide further recommendations.  We recommend Tylenol or Motrin for any lingering discomfort.  Please return to the Emergency Department if you experience any worsening of your condition.  Thank you for allowing [place] to be a part of your care.        Sabas Sous, MD 04/07/20 [phone]

## 2020-04-06 NOTE — ED Triage Notes (Signed)
Patient arrived with complaints of right sided flank pain that started tonight around 730pm. Declines any urinary symptoms. Took Ibuprofen and had one episode of emesis afterwards.

## 2020-04-07 NOTE — Discharge Instructions (Addendum)
You were evaluated in the Emergency Department and after careful evaluation, we did not find any emergent condition requiring admission or further testing in the hospital.  Your exam/testing today was overall reassuring.  Your symptoms seem to be due to a kidney stone, which seems to have already passed into your bladder.  We recommend straining your urine and following up with urology.  They may be able to test the kidney stone and provide further recommendations.  We recommend Tylenol or Motrin for any lingering discomfort.  Please return to the Emergency Department if you experience any worsening of your condition.  Thank you for allowing [place] to be a part of your care.

## 2022-06-26 ENCOUNTER — Encounter (HOSPITAL_COMMUNITY): Payer: Self-pay

## 2022-06-26 ENCOUNTER — Other Ambulatory Visit: Payer: Self-pay

## 2022-06-26 ENCOUNTER — Emergency Department (HOSPITAL_COMMUNITY): Payer: BLUE CROSS/BLUE SHIELD

## 2022-06-26 ENCOUNTER — Inpatient Hospital Stay (HOSPITAL_COMMUNITY)
Admission: EM | Admit: 2022-06-26 | Discharge: 2022-07-02 | DRG: 871 | Disposition: A | Discharge status: Home / Self Care | Payer: BLUE CROSS/BLUE SHIELD | Attending: Obstetrics and Gynecology | Admitting: Obstetrics and Gynecology

## 2022-06-26 DIAGNOSIS — F411 Generalized anxiety disorder: Secondary | ICD-10-CM | POA: Diagnosis present

## 2022-06-26 DIAGNOSIS — E876 Hypokalemia: Secondary | ICD-10-CM | POA: Diagnosis present

## 2022-06-26 DIAGNOSIS — Z853 Personal history of malignant neoplasm of breast: Secondary | ICD-10-CM

## 2022-06-26 DIAGNOSIS — Z882 Allergy status to sulfonamides status: Secondary | ICD-10-CM

## 2022-06-26 DIAGNOSIS — I671 Cerebral aneurysm, nonruptured: Secondary | ICD-10-CM | POA: Diagnosis present

## 2022-06-26 DIAGNOSIS — N179 Acute kidney failure, unspecified: Secondary | ICD-10-CM | POA: Diagnosis present

## 2022-06-26 DIAGNOSIS — Z79899 Other long term (current) drug therapy: Secondary | ICD-10-CM

## 2022-06-26 DIAGNOSIS — A419 Sepsis, unspecified organism: Secondary | ICD-10-CM | POA: Diagnosis present

## 2022-06-26 DIAGNOSIS — R053 Chronic cough: Secondary | ICD-10-CM | POA: Diagnosis present

## 2022-06-26 DIAGNOSIS — Z6828 Body mass index (BMI) 28.0-28.9, adult: Secondary | ICD-10-CM

## 2022-06-26 DIAGNOSIS — R197 Diarrhea, unspecified: Secondary | ICD-10-CM | POA: Diagnosis present

## 2022-06-26 DIAGNOSIS — J189 Pneumonia, unspecified organism: Secondary | ICD-10-CM | POA: Diagnosis present

## 2022-06-26 DIAGNOSIS — R6520 Severe sepsis without septic shock: Secondary | ICD-10-CM | POA: Diagnosis present

## 2022-06-26 DIAGNOSIS — Z9011 Acquired absence of right breast and nipple: Secondary | ICD-10-CM | POA: Diagnosis not present

## 2022-06-26 DIAGNOSIS — K219 Gastro-esophageal reflux disease without esophagitis: Secondary | ICD-10-CM | POA: Diagnosis present

## 2022-06-26 DIAGNOSIS — R630 Anorexia: Secondary | ICD-10-CM | POA: Diagnosis present

## 2022-06-26 DIAGNOSIS — Z20822 Contact with and (suspected) exposure to covid-19: Secondary | ICD-10-CM | POA: Diagnosis present

## 2022-06-26 DIAGNOSIS — R0602 Shortness of breath: Secondary | ICD-10-CM | POA: Diagnosis present

## 2022-06-26 DIAGNOSIS — Z90710 Acquired absence of both cervix and uterus: Secondary | ICD-10-CM | POA: Diagnosis not present

## 2022-06-26 DIAGNOSIS — N2589 Other disorders resulting from impaired renal tubular function: Secondary | ICD-10-CM | POA: Diagnosis present

## 2022-06-26 DIAGNOSIS — M069 Rheumatoid arthritis, unspecified: Secondary | ICD-10-CM | POA: Diagnosis present

## 2022-06-26 LAB — URINALYSIS, ROUTINE W REFLEX MICROSCOPIC
Bilirubin Urine: NEGATIVE
Glucose, UA: NEGATIVE mg/dL
Ketones, ur: NEGATIVE mg/dL
Nitrite: NEGATIVE
Protein, ur: 100 mg/dL — AB
Specific Gravity, Urine: 1.019 (ref 1.005–1.030)
pH: 5 (ref 5.0–8.0)

## 2022-06-26 LAB — CBC WITH DIFFERENTIAL/PLATELET
Abs Immature Granulocytes: 0.18 10*3/uL — ABNORMAL HIGH (ref 0.00–0.07)
Basophils Absolute: 0 10*3/uL (ref 0.0–0.1)
Basophils Relative: 0 %
Eosinophils Absolute: 0 10*3/uL (ref 0.0–0.5)
Eosinophils Relative: 0 %
HCT: 37.3 % (ref 36.0–46.0)
Hemoglobin: 12.1 g/dL (ref 12.0–15.0)
Immature Granulocytes: 1 %
Lymphocytes Relative: 9 %
Lymphs Abs: 1.2 10*3/uL (ref 0.7–4.0)
MCH: 28.9 pg (ref 26.0–34.0)
MCHC: 32.4 g/dL (ref 30.0–36.0)
MCV: 89.2 fL (ref 80.0–100.0)
Monocytes Absolute: 0.3 10*3/uL (ref 0.1–1.0)
Monocytes Relative: 2 %
Neutro Abs: 11.3 10*3/uL — ABNORMAL HIGH (ref 1.7–7.7)
Neutrophils Relative %: 88 %
Platelets: 278 10*3/uL (ref 150–400)
RBC: 4.18 MIL/uL (ref 3.87–5.11)
RDW: 13.2 % (ref 11.5–15.5)
WBC: 12.9 10*3/uL — ABNORMAL HIGH (ref 4.0–10.5)
nRBC: 0 % (ref 0.0–0.2)

## 2022-06-26 LAB — COMPREHENSIVE METABOLIC PANEL
ALT: 19 U/L (ref 0–44)
AST: 27 U/L (ref 15–41)
Albumin: 3.1 g/dL — ABNORMAL LOW (ref 3.5–5.0)
Alkaline Phosphatase: 38 U/L (ref 38–126)
Anion gap: 9 (ref 5–15)
BUN: 44 mg/dL — ABNORMAL HIGH (ref 6–20)
CO2: 22 mmol/L (ref 22–32)
Calcium: 7.7 mg/dL — ABNORMAL LOW (ref 8.9–10.3)
Chloride: 106 mmol/L (ref 98–111)
Creatinine, Ser: 1.95 mg/dL — ABNORMAL HIGH (ref 0.44–1.00)
GFR, Estimated: 31 mL/min — ABNORMAL LOW (ref >60)
Glucose, Bld: 112 mg/dL — ABNORMAL HIGH (ref 70–99)
Potassium: 3.2 mmol/L — ABNORMAL LOW (ref 3.5–5.1)
Sodium: 137 mmol/L (ref 135–145)
Total Bilirubin: 1 mg/dL (ref 0.3–1.2)
Total Protein: 7.1 g/dL (ref 6.5–8.1)

## 2022-06-26 LAB — I-STAT BETA HCG BLOOD, ED (MC, WL, AP ONLY): I-stat hCG, quantitative: <5 m[IU]/mL (ref <5)

## 2022-06-26 LAB — LACTIC ACID, PLASMA: Lactic Acid, Venous: 2 mmol/L (ref 0.5–1.9)

## 2022-06-26 LAB — RESP PANEL BY RT-PCR (RSV, FLU A&B, COVID)  RVPGX2
Influenza A by PCR: NEGATIVE
Influenza B by PCR: NEGATIVE
Resp Syncytial Virus by PCR: NEGATIVE
SARS Coronavirus 2 by RT PCR: NEGATIVE

## 2022-06-26 LAB — PROTIME-INR
INR: 1.4 — ABNORMAL HIGH (ref 0.8–1.2)
Prothrombin Time: 16.8 seconds — ABNORMAL HIGH (ref 11.4–15.2)

## 2022-06-26 LAB — APTT: aPTT: 33 seconds (ref 24–36)

## 2022-06-26 MED ORDER — LACTATED RINGERS IV SOLN: INTRAVENOUS | Status: DC

## 2022-06-26 MED ORDER — SODIUM CHLORIDE 0.9 % IV SOLN
2.0000 g | INTRAVENOUS | Status: AC
  Administered 2022-06-26 – 2022-06-30 (×5): 2 g via INTRAVENOUS
  Filled 2022-06-26 (×5): qty 20

## 2022-06-26 MED ORDER — LACTATED RINGERS IV BOLUS (SEPSIS)
1000.0000 mL | Freq: Once | INTRAVENOUS | Status: AC
  Administered 2022-06-26: 1000 mL via INTRAVENOUS

## 2022-06-26 MED ORDER — SODIUM CHLORIDE 0.9 % IV SOLN
500.0000 mg | INTRAVENOUS | Status: DC
  Administered 2022-06-26 – 2022-06-27 (×2): 500 mg via INTRAVENOUS
  Filled 2022-06-26 (×2): qty 5

## 2022-06-26 MED ORDER — LACTATED RINGERS IV BOLUS (SEPSIS)
250.0000 mL | Freq: Once | INTRAVENOUS | Status: AC
  Administered 2022-06-26: 250 mL via INTRAVENOUS

## 2022-06-26 NOTE — ED Provider Notes (Signed)
[facility]-EMERGENCY DEPT Provider Note   CSN: [id number] Arrival date & time: 06/26/22  2020     History  Chief Complaint  Patient presents with   Shortness of Breath   Pneumonia    Gloria MALIYAH Bowman is a 51 y.o. female.  Patient is a 51 year old female with a history of prior breast cancer currently on maintenance therapy, RA who is presenting today from urgent care due to persistent cough, shortness of breath and fever.  She reports that for the last 2 weeks she has been ill with generalized malaise, fever, cough which has not been particularly productive and feeling short of breath most notably with exertion.  She denies any chest pain, abdominal pain, nausea vomiting or diarrhea.  She has no prior history of lung issues and does not use inhalers at home.  She denies being a smoker and does not take any blood pressure medications and is no history of clots.  She has not had unilateral leg pain or swelling.  The history is provided by the patient.  Shortness of Breath Pneumonia Associated symptoms include shortness of breath.       Home Medications Prior to Admission medications   Medication Sig Start Date End Date Taking? Authorizing Provider  ibuprofen (ADVIL,MOTRIN) 800 MG tablet Take 1 tablet (800 mg total) by mouth every 8 (eight) hours as needed. Patient not taking: Reported on 02/07/2020 02/03/17   Long, Arlyss Repress, MD  ibuprofen (ADVIL,MOTRIN) 800 MG tablet TAKE 1 TABLET BY MOUTH EVERY 8 HOURS AS NEEDED 04/22/18   Tarry Kos, MD  metroNIDAZOLE (FLAGYL) 500 MG tablet Take 1 tablet (500 mg total) by mouth 2 (two) times daily. Patient not taking: Reported on 02/18/2017 01/11/13   Rodolph Bong, MD  traMADol (ULTRAM) 50 MG tablet Take 1 tablet (50 mg total) by mouth every 6 (six) hours as needed. Patient not taking: Reported on 02/18/2017 02/03/17   Long, Arlyss Repress, MD      Allergies    Bactrim [sulfamethoxazole-trimethoprim]    Review of Systems   Review  of Systems  Respiratory:  Positive for shortness of breath.     Physical Exam Updated Vital Signs BP (!) 88/66   Pulse (!) 106   Temp 98.7 F (37.1 C)   Resp 18   Ht 5\' 1"  (1.549 m)   Wt 68 kg   SpO2 94%   BMI 28.34 kg/m  Physical Exam Vitals and nursing note reviewed.  Constitutional:      General: She is not in acute distress.    Appearance: She is well-developed.  HENT:     Head: Normocephalic and atraumatic.     Mouth/Throat:     Mouth: Mucous membranes are dry.  Eyes:     Pupils: Pupils are equal, round, and reactive to light.  Cardiovascular:     Rate and Rhythm: Regular rhythm. Tachycardia present.     Heart sounds: Normal heart sounds. No murmur heard.    No friction rub.  Pulmonary:     Effort: Pulmonary effort is normal. Tachypnea present.     Breath sounds: Examination of the right-lower field reveals rhonchi. Examination of the left-lower field reveals rhonchi. Rhonchi present. No wheezing or rales.  Abdominal:     General: Bowel sounds are normal. There is no distension.     Palpations: Abdomen is soft.     Tenderness: There is no abdominal tenderness. There is no guarding or rebound.  Musculoskeletal:  General: No tenderness. Normal range of motion.     Right lower leg: No edema.     Left lower leg: No edema.     Comments: Lymphedema present in the right upper extremity  Skin:    General: Skin is warm and dry.     Findings: No rash.  Neurological:     Mental Status: She is alert and oriented to person, place, and time.     Cranial Nerves: No cranial nerve deficit.  Psychiatric:        Behavior: Behavior normal.     ED Results / Procedures / Treatments   Labs (all labs ordered are listed, but only abnormal results are displayed) Labs Reviewed  LACTIC ACID, PLASMA - Abnormal; Notable for the following components:      Result Value   Lactic Acid, Venous 2.0 (*)    All other components within normal limits  COMPREHENSIVE METABOLIC PANEL -  Abnormal; Notable for the following components:   Potassium 3.2 (*)    Glucose, Bld 112 (*)    BUN 44 (*)    Creatinine, Ser 1.95 (*)    Calcium 7.7 (*)    Albumin 3.1 (*)    GFR, Estimated 31 (*)    All other components within normal limits  CBC WITH DIFFERENTIAL/PLATELET - Abnormal; Notable for the following components:   WBC 12.9 (*)    Neutro Abs 11.3 (*)    Abs Immature Granulocytes 0.18 (*)    All other components within normal limits  PROTIME-INR - Abnormal; Notable for the following components:   Prothrombin Time 16.8 (*)    INR 1.4 (*)    All other components within normal limits  RESP PANEL BY RT-PCR (RSV, FLU A&B, COVID)  [id number]  CULTURE, BLOOD (ROUTINE X 2)  CULTURE, BLOOD (ROUTINE X 2)  URINE CULTURE  APTT  LACTIC ACID, PLASMA  URINALYSIS, ROUTINE W REFLEX MICROSCOPIC  I-STAT BETA HCG BLOOD, ED ([facility], [facility], [facility] ONLY)    EKG EKG Interpretation  Date/Time:  Wednesday June 26 2022 21:30:23 EST Ventricular Rate:  104 PR Interval:  154 QRS Duration: 87 QT Interval:  346 QTC Calculation: 456 R Axis:   54 Text Interpretation: Sinus tachycardia Biatrial enlargement Abnormal R-wave progression, early transition No significant change since last tracing Confirmed by Gwyneth Sprout ([id number]) on 06/26/2022 9:42:45 PM  Radiology DG Chest Port 1 View  Result Date: 06/26/2022 CLINICAL DATA:  Questionable sepsis.  Fever and body aches. EXAM: PORTABLE CHEST 1 VIEW COMPARISON:  Chest CT 03/15/2021 FINDINGS: The cardiomediastinal silhouette and vascular pattern are normal. There is calcification of the transverse aorta. There is haziness in the lung bases which could be due to pneumonia or due to the patient's overlying breast implants. A standing PA and lateral study in full inspiration is recommended. The sulci are sharp. The mid to upper lung fields are clear. Mild osteopenia. IMPRESSION: Haziness in the lung bases which could be due to pneumonia or due to the patient's  overlying breast implants. A standing PA and lateral study in full inspiration is recommended. Electronically Signed   By: Almira Bar M.D.   On: 06/26/2022 21:40    Procedures Procedures    Medications Ordered in ED Medications  lactated ringers infusion (has no administration in time range)  lactated ringers bolus 1,000 mL (1,000 mLs Intravenous New Bag/Given 06/26/22 2234)    And  lactated ringers bolus 1,000 mL (1,000 mLs Intravenous New Bag/Given 06/26/22 2236)    And  lactated  ringers bolus 250 mL (has no administration in time range)  cefTRIAXone (ROCEPHIN) 2 g in sodium chloride 0.9 % 100 mL IVPB (2 g Intravenous New Bag/Given 06/26/22 2233)  azithromycin (ZITHROMAX) 500 mg in sodium chloride 0.9 % 250 mL IVPB (has no administration in time range)    ED Course/ Medical Decision Making/ A&P                           Medical Decision Making Amount and/or Complexity of Data Reviewed External Data Reviewed: notes.    Details: Urgent care Labs: ordered. Decision-making details documented in ED Course. Radiology: ordered and independent interpretation performed. Decision-making details documented in ED Course. ECG/medicine tests: ordered and independent interpretation performed. Decision-making details documented in ED Course.  Risk Decision regarding hospitalization.   Pt with multiple medical problems and comorbidities and presenting today with a complaint that caries a high risk for morbidity and mortality.  Here today with concern for sepsis.  Patient had a temperature of 102.7 at urgent care prior to arrival and an x-ray that showed concern for multilobar pneumonia.  Patient was found to be tachycardic, hypotensive upon arrival here with tachypnea.  Sats are 94% on room air which did improve at rest to 100% but she is still tachypneic.  She is mentating normally has no localized areas of pain.  At this time concern for infectious etiology as the cause for her symptoms.   Lower suspicion for PE, ACS or acute abdominal pathology.  Sepsis order set was initiated.  I independently interpreted patient's labs and EKG.  Patient's EKG shows no acute findings other than sinus tachycardia, CBC with leukocytosis of 12, stable hemoglobin, CMP with new AKI with creatinine of 1.95, normal anion gap and mild hypokalemia of 3.2, lactate elevated at 2.0 and INR is 1.4.  Patient is negative for flu and COVID.  After 500 mL of fluid blood pressure is improving and is down at 97/58.  Patient's oxygen saturation is now 100% on room air.  She is still tachypneic.  She was covered with Rocephin and azithromycin.  Feel that patient needs admission for treatment for pneumonia and sepsis.  Findings discussed with the patient.  She is comfortable with this plan.         Final Clinical Impression(s) / ED Diagnoses Final diagnoses:  Community acquired pneumonia, unspecified laterality  Sepsis with acute renal failure without septic shock, due to unspecified organism, unspecified acute renal failure type [facility])    Rx / DC Orders ED Discharge Orders     None         Gwyneth Sprout, MD 06/26/22 2259

## 2022-06-26 NOTE — ED Triage Notes (Signed)
Pt states that she has been dx with PNA today but has been sick for 2 weeks. Pt has SHOB and body aches.

## 2022-06-26 NOTE — Sepsis Progress Note (Signed)
Following per sepsis protocol   

## 2022-06-26 NOTE — ED Provider Triage Note (Signed)
Emergency Medicine Provider Triage Evaluation Note  Gloria Bowman , a 51 y.o. female  was evaluated in triage.  Patient presents for concern of pneumonia.  She was diagnosed with pneumonia at urgent care today.  She was referred to the emergency room.  Upon arrival to the emergency room patient is hypotensive, febrile at urgent care, currently tachycardic.  She has multilobar pneumonia.  She is SIRS positive.  Will initiate sepsis protocol.  Review of Systems  Positive: As above Negative: As above  Physical Exam  BP (!) 88/66   Pulse (!) 106   Temp 98.7 F (37.1 C)   Resp 18   Ht 5\' 1"  (1.549 m)   Wt 68 kg   SpO2 94%   BMI 28.34 kg/m  Gen:   Awake, no distress   Resp:  Normal effort  MSK:   Moves extremities without difficulty  Other:    Medical Decision Making  Medically screening exam initiated at 9:20 PM.  Appropriate orders placed.  Gloria Bowman was informed that the remainder of the evaluation will be completed by another provider, this initial triage assessment does not replace that evaluation, and the importance of remaining in the ED until their evaluation is complete.    Olegario Shearer, PA-C 06/26/22 2121

## 2022-06-27 ENCOUNTER — Encounter (HOSPITAL_COMMUNITY): Payer: Self-pay | Admitting: Family Medicine

## 2022-06-27 DIAGNOSIS — A419 Sepsis, unspecified organism: Secondary | ICD-10-CM | POA: Diagnosis present

## 2022-06-27 DIAGNOSIS — J189 Pneumonia, unspecified organism: Secondary | ICD-10-CM | POA: Diagnosis not present

## 2022-06-27 DIAGNOSIS — N179 Acute kidney failure, unspecified: Secondary | ICD-10-CM | POA: Diagnosis present

## 2022-06-27 LAB — CBC
HCT: 40.5 % (ref 36.0–46.0)
Hemoglobin: 12.9 g/dL (ref 12.0–15.0)
MCH: 28.9 pg (ref 26.0–34.0)
MCHC: 31.9 g/dL (ref 30.0–36.0)
MCV: 90.8 fL (ref 80.0–100.0)
Platelets: 234 10*3/uL (ref 150–400)
RBC: 4.46 MIL/uL (ref 3.87–5.11)
RDW: 13.2 % (ref 11.5–15.5)
WBC: 11.3 10*3/uL — ABNORMAL HIGH (ref 4.0–10.5)
nRBC: 0 % (ref 0.0–0.2)

## 2022-06-27 LAB — C DIFFICILE QUICK SCREEN W PCR REFLEX
C Diff antigen: NEGATIVE
C Diff interpretation: NOT DETECTED
C Diff toxin: NEGATIVE

## 2022-06-27 LAB — BASIC METABOLIC PANEL
Anion gap: 10 (ref 5–15)
BUN: 39 mg/dL — ABNORMAL HIGH (ref 6–20)
CO2: 25 mmol/L (ref 22–32)
Calcium: 7.8 mg/dL — ABNORMAL LOW (ref 8.9–10.3)
Chloride: 106 mmol/L (ref 98–111)
Creatinine, Ser: 1.49 mg/dL — ABNORMAL HIGH (ref 0.44–1.00)
GFR, Estimated: 42 mL/min — ABNORMAL LOW (ref >60)
Glucose, Bld: 109 mg/dL — ABNORMAL HIGH (ref 70–99)
Potassium: 3.6 mmol/L (ref 3.5–5.1)
Sodium: 141 mmol/L (ref 135–145)

## 2022-06-27 LAB — PROCALCITONIN: Procalcitonin: 39.06 ng/mL

## 2022-06-27 LAB — LACTIC ACID, PLASMA: Lactic Acid, Venous: 2 mmol/L (ref 0.5–1.9)

## 2022-06-27 LAB — MAGNESIUM: Magnesium: 1.9 mg/dL (ref 1.7–2.4)

## 2022-06-27 LAB — HIV ANTIBODY (ROUTINE TESTING W REFLEX): HIV Screen 4th Generation wRfx: NONREACTIVE

## 2022-06-27 MED ORDER — ACETAMINOPHEN 325 MG PO TABS
650.0000 mg | ORAL_TABLET | Freq: Four times a day (QID) | ORAL | Status: DC | PRN
  Administered 2022-06-27 – 2022-07-02 (×8): 650 mg via ORAL
  Filled 2022-06-27 (×8): qty 2

## 2022-06-27 MED ORDER — SODIUM CHLORIDE 0.9 % IV SOLN: INTRAVENOUS | Status: DC

## 2022-06-27 MED ORDER — MELATONIN 5 MG PO TABS
5.0000 mg | ORAL_TABLET | Freq: Once | ORAL | Status: AC
  Administered 2022-06-27: 5 mg via ORAL
  Filled 2022-06-27: qty 1

## 2022-06-27 MED ORDER — SODIUM CHLORIDE 0.9% FLUSH
3.0000 mL | Freq: Two times a day (BID) | INTRAVENOUS | Status: DC
  Administered 2022-06-27 – 2022-07-02 (×8): 3 mL via INTRAVENOUS

## 2022-06-27 MED ORDER — OXYCODONE HCL 5 MG PO TABS
5.0000 mg | ORAL_TABLET | ORAL | Status: DC | PRN
  Administered 2022-06-27 – 2022-07-02 (×14): 5 mg via ORAL
  Filled 2022-06-27 (×15): qty 1

## 2022-06-27 MED ORDER — SODIUM CHLORIDE 0.9 % IV BOLUS
500.0000 mL | Freq: Once | INTRAVENOUS | Status: AC
  Administered 2022-06-27: 500 mL via INTRAVENOUS

## 2022-06-27 MED ORDER — POTASSIUM CHLORIDE CRYS ER 20 MEQ PO TBCR
20.0000 meq | EXTENDED_RELEASE_TABLET | Freq: Once | ORAL | Status: AC
  Administered 2022-06-27: 20 meq via ORAL
  Filled 2022-06-27: qty 1

## 2022-06-27 MED ORDER — ONDANSETRON HCL 4 MG PO TABS: 4.0000 mg | ORAL_TABLET | Freq: Four times a day (QID) | ORAL | Status: DC | PRN

## 2022-06-27 MED ORDER — CYCLOBENZAPRINE HCL 5 MG PO TABS
5.0000 mg | ORAL_TABLET | Freq: Three times a day (TID) | ORAL | Status: DC | PRN
  Administered 2022-06-28 – 2022-07-01 (×4): 5 mg via ORAL
  Filled 2022-06-27 (×4): qty 1

## 2022-06-27 MED ORDER — HEPARIN SODIUM (PORCINE) 5000 UNIT/ML IJ SOLN
5000.0000 [IU] | Freq: Three times a day (TID) | INTRAMUSCULAR | Status: DC
  Administered 2022-06-27 – 2022-07-02 (×16): 5000 [IU] via SUBCUTANEOUS
  Filled 2022-06-27 (×16): qty 1

## 2022-06-27 MED ORDER — ACETAMINOPHEN 650 MG RE SUPP: 650.0000 mg | Freq: Four times a day (QID) | RECTAL | Status: DC | PRN

## 2022-06-27 MED ORDER — ONDANSETRON HCL 4 MG/2ML IJ SOLN: 4.0000 mg | Freq: Four times a day (QID) | INTRAMUSCULAR | Status: DC | PRN

## 2022-06-27 MED ORDER — GUAIFENESIN-DM 100-10 MG/5ML PO SYRP
5.0000 mL | ORAL_SOLUTION | ORAL | Status: DC | PRN
  Administered 2022-06-28 (×2): 5 mL via ORAL
  Filled 2022-06-27 (×2): qty 10

## 2022-06-27 MED ORDER — DIAZEPAM 5 MG PO TABS: 5.0000 mg | ORAL_TABLET | Freq: Four times a day (QID) | ORAL | Status: DC | PRN

## 2022-06-27 NOTE — Progress Notes (Signed)
  Transition of Care [facility]) Screening Note   Patient Details  Name: Gloria Bowman Date of Birth: 1970/07/05   Transition of Care [facility]) CM/SW Contact:    Lanier Clam, RN Phone Number: 06/27/2022, 12:17 PM    Transition of Care Department [facility]) has reviewed patient and no TOC needs have been identified at this time. We will continue to monitor patient advancement through interdisciplinary progression rounds. If new patient transition needs arise, please place a TOC consult.

## 2022-06-27 NOTE — H&P (Signed)
History and Physical    Gloria Bowman FVC:[id number] DOB: 1971-06-23 DOA: 06/26/2022  PCP: Virgilio Belling, PA-C   Patient coming from: Home   Chief Complaint: SOB, cough, malaise   HPI: Gloria Bowman is a 51 y.o. female with medical history significant for cancer of the right breast, rheumatoid arthritis, and left ICA aneurysm who presents to the emergency department with shortness of breath, cough, aches, and fatigue.  Patient reports that she went to an urgent care today for evaluation of cough, shortness of breath, aches, and malaise that have been present for approximately 2 weeks.  She was said to be febrile at the urgent care and had a chest x-ray concerning for pneumonia.  She was sent to the ED for management.  She denies abdominal pain, nausea, or vomiting but has had some loose stools in the last 1 to 2 days.  She has had a loss of appetite throughout this illness.  She is being followed by neurosurgery at [facility] for left ICA aneurysm with plans for possible embolization.    ED Course: Upon arrival to the ED, patient is found to be afebrile and saturating mid 90s on room air with mild tachycardia and systolic blood pressure as low as 88.  EKG demonstrates sinus tachycardia and chest x-ray raises concern for basilar pneumonia.  COVID, influenza, and RSV PCR were negative.  Lactic acid was 2.0.  WBC was 12,900, potassium 3.2, BUN 44, and creatinine 1.95.  Blood and urine cultures were collected in the ED and the patient was treated with 2.25 L of LR, Rocephin, and azithromycin.  Review of Systems:  All other systems reviewed and apart from HPI, are negative.  Past Medical History:  Diagnosis Date   RA (rheumatoid arthritis) (HCC)     Past Surgical History:  Procedure Laterality Date   ABDOMINAL HYSTERECTOMY     MASTECTOMY      Social History:   reports that she has never smoked. She has never used smokeless tobacco. She reports that she does not drink  alcohol and does not use drugs.  Allergies  Allergen Reactions   Bactrim [Sulfamethoxazole-Trimethoprim]     History reviewed. No pertinent family history.   Prior to Admission medications   Medication Sig Start Date End Date Taking? Authorizing Provider  ibuprofen (ADVIL,MOTRIN) 800 MG tablet Take 1 tablet (800 mg total) by mouth every 8 (eight) hours as needed. Patient not taking: Reported on 02/07/2020 02/03/17   Long, Arlyss Repress, MD  ibuprofen (ADVIL,MOTRIN) 800 MG tablet TAKE 1 TABLET BY MOUTH EVERY 8 HOURS AS NEEDED 04/22/18   Tarry Kos, MD  metroNIDAZOLE (FLAGYL) 500 MG tablet Take 1 tablet (500 mg total) by mouth 2 (two) times daily. Patient not taking: Reported on 02/18/2017 01/11/13   Rodolph Bong, MD  traMADol (ULTRAM) 50 MG tablet Take 1 tablet (50 mg total) by mouth every 6 (six) hours as needed. Patient not taking: Reported on 02/18/2017 02/03/17   Maia Plan, MD    Physical Exam: Vitals:   06/26/22 2110 06/26/22 2119 06/27/22 0117  BP: (!) 88/66    Pulse: (!) 106    Resp: 18    Temp: 98.7 F (37.1 C)  98.5 F (36.9 C)  TempSrc:   Oral  SpO2: 94%    Weight:  68 kg   Height:  5\' 1"  (1.549 m)     Constitutional: NAD, no pallor or diaphoresis   Eyes: PERTLA, lids and conjunctivae normal ENMT:  Mucous membranes are moist. Posterior pharynx clear of any exudate or lesions.   Neck: supple, no masses  Respiratory: no wheezing, no crackles. No accessory muscle use.  Cardiovascular: S1 & S2 heard, regular rate and rhythm. No JVD. Abdomen: No distension, no tenderness, soft. Bowel sounds active.  Musculoskeletal: no clubbing / cyanosis. No joint deformity upper and lower extremities.   Skin: no significant rashes, lesions, ulcers. Warm, dry, well-perfused. Neurologic: CN 2-12 grossly intact. Moving all extremities. Alert and oriented.  Psychiatric: Calm. Cooperative.    Labs and Imaging on Admission: I have personally reviewed following labs and imaging  studies  CBC: Recent Labs  Lab 06/26/22 2148  WBC 12.9*  NEUTROABS 11.3*  HGB 12.1  HCT 37.3  MCV 89.2  PLT 278   Basic Metabolic Panel: Recent Labs  Lab 06/26/22 2148  NA 137  K 3.2*  CL 106  CO2 22  GLUCOSE 112*  BUN 44*  CREATININE 1.95*  CALCIUM 7.7*   GFR: Estimated Creatinine Clearance: 30.1 mL/min (A) (by C-G formula based on SCr of 1.95 mg/dL (H)). Liver Function Tests: Recent Labs  Lab 06/26/22 2148  AST 27  ALT 19  ALKPHOS 38  BILITOT 1.0  PROT 7.1  ALBUMIN 3.1*   No results for input(s): "LIPASE", "AMYLASE" in the last 168 hours. No results for input(s): "AMMONIA" in the last 168 hours. Coagulation Profile: Recent Labs  Lab 06/26/22 2148  INR 1.4*   Cardiac Enzymes: No results for input(s): "CKTOTAL", "CKMB", "CKMBINDEX", "TROPONINI" in the last 168 hours. BNP (last 3 results) No results for input(s): "PROBNP" in the last 8760 hours. HbA1C: No results for input(s): "HGBA1C" in the last 72 hours. CBG: No results for input(s): "GLUCAP" in the last 168 hours. Lipid Profile: No results for input(s): "CHOL", "HDL", "LDLCALC", "TRIG", "CHOLHDL", "LDLDIRECT" in the last 72 hours. Thyroid Function Tests: No results for input(s): "TSH", "T4TOTAL", "FREET4", "T3FREE", "THYROIDAB" in the last 72 hours. Anemia Panel: No results for input(s): "VITAMINB12", "FOLATE", "FERRITIN", "TIBC", "IRON", "RETICCTPCT" in the last 72 hours. Urine analysis:    Component Value Date/Time   COLORURINE AMBER (A) 06/26/2022 2306   APPEARANCEUR CLOUDY (A) 06/26/2022 2306   LABSPEC 1.019 06/26/2022 2306   PHURINE 5.0 06/26/2022 2306   GLUCOSEU NEGATIVE 06/26/2022 2306   HGBUR MODERATE (A) 06/26/2022 2306   BILIRUBINUR NEGATIVE 06/26/2022 2306   KETONESUR NEGATIVE 06/26/2022 2306   PROTEINUR 100 (A) 06/26/2022 2306   UROBILINOGEN 0.2 01/08/2013 1220   NITRITE NEGATIVE 06/26/2022 2306   LEUKOCYTESUR SMALL (A) 06/26/2022 2306   Sepsis  Labs: (procalcitonin:4,lacticidven:4) ) Recent Results (from the past 240 hour(s))  Resp panel by RT-PCR (RSV, Flu A&B, Covid) Anterior Nasal Swab     Status: None   Collection Time: 06/26/22  9:30 PM   Specimen: Anterior Nasal Swab  Result Value Ref Range Status   SARS Coronavirus 2 by RT PCR NEGATIVE NEGATIVE Final    Comment: (NOTE) SARS-CoV-2 target nucleic acids are NOT DETECTED.  The SARS-CoV-2 RNA is generally detectable in upper respiratory specimens during the acute phase of infection. The lowest concentration of SARS-CoV-2 viral copies this assay can detect is 138 copies/mL. A negative result does not preclude SARS-Cov-2 infection and should not be used as the sole basis for treatment or other patient management decisions. A negative result may occur with  improper specimen collection/handling, submission of specimen other than nasopharyngeal swab, presence of viral mutation(s) within the areas targeted by this assay, and inadequate number of viral copies(<138  copies/mL). A negative result must be combined with clinical observations, patient history, and epidemiological information. The expected result is Negative.  Fact Sheet for Patients:  BloggerCourse.com  Fact Sheet for Healthcare Providers:  SeriousBroker.it  This test is no t yet approved or cleared by the [place] FDA and  has been authorized for detection and/or diagnosis of SARS-CoV-2 by FDA under an Emergency Use Authorization (EUA). This EUA will remain  in effect (meaning this test can be used) for the duration of the COVID-19 declaration under Section 564(b)(1) of the Act, 21 U.S.C.section 360bbb-3(b)(1), unless the authorization is terminated  or revoked sooner.       Influenza A by PCR NEGATIVE NEGATIVE Final   Influenza B by PCR NEGATIVE NEGATIVE Final    Comment: (NOTE) The Xpert Xpress SARS-CoV-2/FLU/RSV plus assay is intended as  an aid in the diagnosis of influenza from Nasopharyngeal swab specimens and should not be used as a sole basis for treatment. Nasal washings and aspirates are unacceptable for Xpert Xpress SARS-CoV-2/FLU/RSV testing.  Fact Sheet for Patients: BloggerCourse.com  Fact Sheet for Healthcare Providers: SeriousBroker.it  This test is not yet approved or cleared by the [place] FDA and has been authorized for detection and/or diagnosis of SARS-CoV-2 by FDA under an Emergency Use Authorization (EUA). This EUA will remain in effect (meaning this test can be used) for the duration of the COVID-19 declaration under Section 564(b)(1) of the Act, 21 U.S.C. section 360bbb-3(b)(1), unless the authorization is terminated or revoked.     Resp Syncytial Virus by PCR NEGATIVE NEGATIVE Final    Comment: (NOTE) Fact Sheet for Patients: BloggerCourse.com  Fact Sheet for Healthcare Providers: SeriousBroker.it  This test is not yet approved or cleared by the [place] FDA and has been authorized for detection and/or diagnosis of SARS-CoV-2 by FDA under an Emergency Use Authorization (EUA). This EUA will remain in effect (meaning this test can be used) for the duration of the COVID-19 declaration under Section 564(b)(1) of the Act, 21 U.S.C. section 360bbb-3(b)(1), unless the authorization is terminated or revoked.  Performed at [facility], [address], [place], [place] [address]      Radiological Exams on Admission: DG Chest Port 1 View  Result Date: 06/26/2022 CLINICAL DATA:  Questionable sepsis.  Fever and body aches. EXAM: PORTABLE CHEST 1 VIEW COMPARISON:  Chest CT 03/15/2021 FINDINGS: The cardiomediastinal silhouette and vascular pattern are normal. There is calcification of the transverse aorta. There is haziness in the lung bases which could be due to  pneumonia or due to the patient's overlying breast implants. A standing PA and lateral study in full inspiration is recommended. The sulci are sharp. The mid to upper lung fields are clear. Mild osteopenia. IMPRESSION: Haziness in the lung bases which could be due to pneumonia or due to the patient's overlying breast implants. A standing PA and lateral study in full inspiration is recommended. Electronically Signed   By: Almira Bar M.D.   On: 06/26/2022 21:40    EKG: Independently reviewed. Sinus tachycardia.   Assessment/Plan   1. Severe sepsis d/t pneumonia  - Blood cultures were collected in ED, 30 cc/kg LR bolus was administered, and she was started on Rocephin and azithromycin  - Continue Rocephin and azithromycin, trend procalcitonin, check strep pneumo and legionella antigens   2. AKI  - SCr is 1.95 on admission, up from 0.65 on 06/14/22  - She was fluid-resuscitated with 2.25 liters LR in ED  - Check UA  with microscopy, continue IVF hydration, renally-dose medications, repeat chem panel in am    3. Hypokalemia  - Replacing     DVT prophylaxis: sq heparin  Code Status: Full  Level of Care: Level of care: Progressive Family Communication: Son at bedside  Disposition Plan:  Patient is from: home  Anticipated d/c is to: Home  Anticipated d/c date is: 06/30/22  Patient currently: pending improved sepsis parameters   Consults called: none  Admission status: Inpatient     Briscoe Deutscher, MD [facility]  06/27/2022, 1:22 AM

## 2022-06-27 NOTE — Progress Notes (Signed)
RED MEWS:        [facility] Charge RN Lovette Cliche. Successfully notified via secure chat.  06/27/22 1150  Vitals  Temp (!) 101.8 F (38.8 C)  Temp Source Oral  BP 103/61  MAP (mmHg) 73  BP Location Left Wrist  BP Method Automatic  Patient Position (if appropriate) Lying  Pulse Rate (!) 104  Pulse Rate Source Dinamap  Resp (!) 28  MEWS COLOR  MEWS Score Color Red  Oxygen Therapy  SpO2 97 %  O2 Device Nasal Cannula  MEWS Score  MEWS Temp 2  MEWS Systolic 0  MEWS Pulse 1  MEWS RR 2  MEWS LOC 0  MEWS Score 5

## 2022-06-27 NOTE — Progress Notes (Signed)
PROGRESS NOTE   Gloria Bowman  JJK:[id number] DOB: 10/20/1970 DOA: 06/26/2022 PCP: Chyrel Masson  Brief Narrative:  51 year old black female known history of right-sided breast cancer status post mastectomy 01/2016, left DCIS Rx as well-complication from lymphedema Left ICA aneurysm to be followed at [facility] for embolization Rheumatoid arthritis  Presented to urgent care for cough SOB malaise X 2 weeks Afebrile sats 90s CXR = basilar pneumonia and respiratory illness panel negative WBC 12 Lactic acid 2  Hospital-Problem based course  Severe sepsis on admission secondary to community-acquired pneumonia - Received bolus of fluids in the emergency room - Give saline 500 cc now and then 125 cc/H as Muse is red and patient is febrile - Continue azithromycin and ceftriaxone General coverage at this time  AKI 2/2 sepsis with component renal acidosis - Continue IV fluid as above - Check labs a.m. as is improving slightly  Left ICA aneurysm - Follow-up with plans for embolization at [facility] is stable at this time  Rheumatoid arthritis -Hold Plaquenil 200 with specialized dosing at this time - Possibly contributes to some of her infectious causes  Bilateral breast cancer 2017 1996 - Outpatient surveillance - Holding anastrozole 1 daily  Anxiety - Resume Flexeril 5 3 times daily spasm, Valium 5 every 6 as needed anxiety  DVT prophylaxis: Heparin Code Status: Full Family Communication: None Disposition:  Status is: Inpatient Remains inpatient appropriate because:   Still ill      Subjective: Does not feel well Drinking some but not really eating Some diarrhea-states that this started about a week and a half ago and has had several episodes  Antibiotics or ill contacts  Objective: Vitals:   06/27/22 0143 06/27/22 0202 06/27/22 0500 06/27/22 0608  BP: 110/67 (!) 102/90  (!) 104/58  Pulse: 96 (!) 102  (!) 110  Resp: 18 20  16   Temp:  98.6 F (37  C)  (!) 100.6 F (38.1 C)  TempSrc:  Oral    SpO2: 100% 96%  99%  Weight:   68 kg   Height:        Intake/Output Summary (Last 24 hours) at 06/27/2022 0854 Last data filed at 06/27/2022 0544 Gross per 24 hour  Intake 3035.54 ml  Output 350 ml  Net 2685.54 ml   Filed Weights   06/26/22 2119 06/27/22 0500  Weight: 68 kg 68 kg    Examination:  EOMI NCAT no focal deficit Tired appearing black female in no distress On oxygen CTAB with decreased air entry right posterior lateral lung fields S1-S2 slightly tachycardic Abdomen soft no rebound no guarding No lower extremity edema Multiple tattoos  Data Reviewed: personally reviewed   CBC    Component Value Date/Time   WBC 11.3 (H) 06/27/2022 0311   RBC 4.46 06/27/2022 0311   HGB 12.9 06/27/2022 0311   HCT 40.5 06/27/2022 0311   PLT 234 06/27/2022 0311   MCV 90.8 06/27/2022 0311   MCH 28.9 06/27/2022 0311   MCHC 31.9 06/27/2022 0311   RDW 13.2 06/27/2022 0311   LYMPHSABS 1.2 06/26/2022 2148   MONOABS 0.3 06/26/2022 2148   EOSABS 0.0 06/26/2022 2148   BASOSABS 0.0 06/26/2022 2148      Latest Ref Rng & Units 06/27/2022    3:11 AM 06/26/2022    9:48 PM 04/06/2020   10:38 PM  CMP  Glucose 70 - 99 mg/dL 06/06/2020  371  696   BUN 6 - 20 mg/dL 39  44  19  Creatinine 0.44 - 1.00 mg/dL 1.49  1.95  1.11   Sodium 135 - 145 mmol/L 141  137  139   Potassium 3.5 - 5.1 mmol/L 3.6  3.2  3.9   Chloride 98 - 111 mmol/L 106  106  102   CO2 22 - 32 mmol/L 25  22  26    Calcium 8.9 - 10.3 mg/dL 7.8  7.7  9.6   Total Protein 6.5 - 8.1 g/dL  7.1  7.8   Total Bilirubin 0.3 - 1.2 mg/dL  1.0  0.6   Alkaline Phos 38 - 126 U/L  38  51   AST 15 - 41 U/L  27  22   ALT 0 - 44 U/L  19  17      Radiology Studies: DG Chest Port 1 View  Result Date: 06/26/2022 CLINICAL DATA:  Questionable sepsis.  Fever and body aches. EXAM: PORTABLE CHEST 1 VIEW COMPARISON:  Chest CT 03/15/2021 FINDINGS: The cardiomediastinal silhouette and vascular pattern  are normal. There is calcification of the transverse aorta. There is haziness in the lung bases which could be due to pneumonia or due to the patient's overlying breast implants. A standing PA and lateral study in full inspiration is recommended. The sulci are sharp. The mid to upper lung fields are clear. Mild osteopenia. IMPRESSION: Haziness in the lung bases which could be due to pneumonia or due to the patient's overlying breast implants. A standing PA and lateral study in full inspiration is recommended. Electronically Signed   By: Telford Nab M.D.   On: 06/26/2022 21:40     Scheduled Meds:  heparin  5,000 Units Subcutaneous Q8H   sodium chloride flush  3 mL Intravenous Q12H   Continuous Infusions:  azithromycin Stopped (06/27/22 0126)   cefTRIAXone (ROCEPHIN)  IV Stopped (06/27/22 0126)     LOS: 1 day   Time spent: [place], MD [facility] To contact the attending provider between 7A-7P or the covering provider during after hours 7P-7A, please log into the web site [email or web address] and access using universal [place] password for that web site. If you do not have the password, please call the hospital operator.  06/27/2022, 8:54 AM

## 2022-06-28 DIAGNOSIS — J189 Pneumonia, unspecified organism: Secondary | ICD-10-CM | POA: Diagnosis not present

## 2022-06-28 DIAGNOSIS — A419 Sepsis, unspecified organism: Secondary | ICD-10-CM | POA: Diagnosis not present

## 2022-06-28 LAB — BASIC METABOLIC PANEL
Anion gap: 5 (ref 5–15)
BUN: 14 mg/dL (ref 6–20)
CO2: 21 mmol/L — ABNORMAL LOW (ref 22–32)
Calcium: 6.9 mg/dL — ABNORMAL LOW (ref 8.9–10.3)
Chloride: 114 mmol/L — ABNORMAL HIGH (ref 98–111)
Creatinine, Ser: 0.71 mg/dL (ref 0.44–1.00)
GFR, Estimated: >60 mL/min (ref >60)
Glucose, Bld: 79 mg/dL (ref 70–99)
Potassium: 2.9 mmol/L — ABNORMAL LOW (ref 3.5–5.1)
Sodium: 140 mmol/L (ref 135–145)

## 2022-06-28 LAB — CBC
HCT: 31.3 % — ABNORMAL LOW (ref 36.0–46.0)
Hemoglobin: 10.2 g/dL — ABNORMAL LOW (ref 12.0–15.0)
MCH: 29.7 pg (ref 26.0–34.0)
MCHC: 32.6 g/dL (ref 30.0–36.0)
MCV: 91.3 fL (ref 80.0–100.0)
Platelets: 248 10*3/uL (ref 150–400)
RBC: 3.43 MIL/uL — ABNORMAL LOW (ref 3.87–5.11)
RDW: 13.3 % (ref 11.5–15.5)
WBC: 9.1 10*3/uL (ref 4.0–10.5)
nRBC: 0 % (ref 0.0–0.2)

## 2022-06-28 LAB — URINE CULTURE

## 2022-06-28 LAB — STREP PNEUMONIAE URINARY ANTIGEN: Strep Pneumo Urinary Antigen: POSITIVE — AB

## 2022-06-28 LAB — MAGNESIUM: Magnesium: 1.8 mg/dL (ref 1.7–2.4)

## 2022-06-28 LAB — PROCALCITONIN: Procalcitonin: 17.76 ng/mL

## 2022-06-28 MED ORDER — LORAZEPAM 0.5 MG PO TABS
0.5000 mg | ORAL_TABLET | Freq: Two times a day (BID) | ORAL | Status: AC | PRN
  Administered 2022-06-28: 0.5 mg via ORAL
  Filled 2022-06-28: qty 1

## 2022-06-28 MED ORDER — POTASSIUM CHLORIDE IN NACL 40-0.9 MEQ/L-% IV SOLN
INTRAVENOUS | Status: DC
  Filled 2022-06-28 (×3): qty 1000

## 2022-06-28 MED ORDER — DEXTROMETHORPHAN POLISTIREX ER 30 MG/5ML PO SUER
15.0000 mg | Freq: Two times a day (BID) | ORAL | Status: DC
  Administered 2022-06-28: 15 mg via ORAL
  Filled 2022-06-28 (×4): qty 5

## 2022-06-28 MED ORDER — POTASSIUM CHLORIDE CRYS ER 20 MEQ PO TBCR
40.0000 meq | EXTENDED_RELEASE_TABLET | Freq: Two times a day (BID) | ORAL | Status: DC
  Administered 2022-06-28 – 2022-06-29 (×3): 40 meq via ORAL
  Filled 2022-06-28 (×3): qty 2

## 2022-06-28 MED ORDER — DIPHENOXYLATE-ATROPINE 2.5-0.025 MG/5ML PO LIQD: 5.0000 mL | Freq: Four times a day (QID) | ORAL | Status: DC | PRN

## 2022-06-28 NOTE — Progress Notes (Signed)
PROGRESS NOTE   Gloria Bowman  NWG:[id number] DOB: 1970-08-18 DOA: 06/26/2022 PCP: Chyrel Masson  Brief Narrative:  51 year old black female known history of right-sided breast cancer status post mastectomy 01/2016, left DCIS Rx as well-complication from lymphedema Left ICA aneurysm to be followed at [facility] for embolization Rheumatoid arthritis  Presented to urgent care for cough SOB malaise X 2 weeks Afebrile sats 90s CXR = basilar pneumonia and respiratory illness panel negative WBC 12 Lactic acid 2  Hospital-Problem based course  Severe sepsis on admission secondary to community-acquired pneumonia - Received bolus of fluids in the emergency room was placed on fluids and subsequently - Strep pneumo from admission is positive so discontinue azithromycin continue ceftriaxone X1 more day and then transition probably to [facility] for pleuritic pain from coughing and reevaluate in a.m.  AKI 2/2 sepsis with component renal acidosis - Continue IV fluid but will cut back dose in the next 24 hours - AKI significantly improved  Severe hypokalemia - Will give IV saline 100 cc/H with 40 of K and also K240 twice daily - Magnesium is 1.8 so do not need to aggressively replace  Left ICA aneurysm - Follow-up with plans for embolization at [facility] is stable at this time  Rheumatoid arthritis -Hold Plaquenil 200 with specialized dosing at this time - Possibly contributes to some of her infectious causes  Bilateral breast cancer 2017 1996 - Outpatient surveillance - Holding anastrozole 1 daily  Diarrhea semiacute -C. difficile and stool panel negative -Start Lomotil and reassess  Anxiety - Resume Flexeril 5 3 times daily spasm, Valium 5 every 6 as needed anxiety  DVT prophylaxis: Heparin Code Status: Full Family Communication: None Disposition:  Status is: Inpatient Remains inpatient appropriate because:   Still ill       Subjective:  Having some pleuritic pain-overall feels little better eating and drinking no nausea no vomiting  Objective: Vitals:   06/27/22 2137 06/28/22 0137 06/28/22 0500 06/28/22 0537  BP: 100/62 108/60  128/64  Pulse: 98 97  95  Resp: 20 20  20   Temp: 99.6 F (37.6 C) 99.4 F (37.4 C)  98.4 F (36.9 C)  TempSrc:      SpO2: 98% 99%  100%  Weight:   69.1 kg   Height:        Intake/Output Summary (Last 24 hours) at 06/28/2022 1021 Last data filed at 06/28/2022 0600 Gross per 24 hour  Intake 2680.55 ml  Output 300 ml  Net 2380.55 ml    Filed Weights   06/26/22 2119 06/27/22 0500 06/28/22 0500  Weight: 68 kg 68 kg 69.1 kg    Examination:  EOMI NCAT no icterus no pallor Neck soft supple Diminished air entry posterolaterally on the right side but otherwise no rales rhonchi or wheeze ROM intact  Data Reviewed: personally reviewed   CBC    Component Value Date/Time   WBC 9.1 06/28/2022 0533   RBC 3.43 (L) 06/28/2022 0533   HGB 10.2 (L) 06/28/2022 0533   HCT 31.3 (L) 06/28/2022 0533   PLT 248 06/28/2022 0533   MCV 91.3 06/28/2022 0533   MCH 29.7 06/28/2022 0533   MCHC 32.6 06/28/2022 0533   RDW 13.3 06/28/2022 0533   LYMPHSABS 1.2 06/26/2022 2148   MONOABS 0.3 06/26/2022 2148   EOSABS 0.0 06/26/2022 2148   BASOSABS 0.0 06/26/2022 2148      Latest Ref Rng & Units 06/28/2022    5:33 AM 06/27/2022    3:11  AM 06/26/2022    9:48 PM  CMP  Glucose 70 - 99 mg/dL 79  109  112   BUN 6 - 20 mg/dL 14  39  44   Creatinine 0.44 - 1.00 mg/dL 0.71  1.49  1.95   Sodium 135 - 145 mmol/L 140  141  137   Potassium 3.5 - 5.1 mmol/L 2.9  3.6  3.2   Chloride 98 - 111 mmol/L 114  106  106   CO2 22 - 32 mmol/L 21  25  22    Calcium 8.9 - 10.3 mg/dL 6.9  7.8  7.7   Total Protein 6.5 - 8.1 g/dL   7.1   Total Bilirubin 0.3 - 1.2 mg/dL   1.0   Alkaline Phos 38 - 126 U/L   38   AST 15 - 41 U/L   27   ALT 0 - 44 U/L   19      Radiology Studies: DG Chest Port 1  View  Result Date: 06/26/2022 CLINICAL DATA:  Questionable sepsis.  Fever and body aches. EXAM: PORTABLE CHEST 1 VIEW COMPARISON:  Chest CT 03/15/2021 FINDINGS: The cardiomediastinal silhouette and vascular pattern are normal. There is calcification of the transverse aorta. There is haziness in the lung bases which could be due to pneumonia or due to the patient's overlying breast implants. A standing PA and lateral study in full inspiration is recommended. The sulci are sharp. The mid to upper lung fields are clear. Mild osteopenia. IMPRESSION: Haziness in the lung bases which could be due to pneumonia or due to the patient's overlying breast implants. A standing PA and lateral study in full inspiration is recommended. Electronically Signed   By: Telford Nab M.D.   On: 06/26/2022 21:40     Scheduled Meds:  heparin  5,000 Units Subcutaneous Q8H   potassium chloride  40 mEq Oral BID   sodium chloride flush  3 mL Intravenous Q12H   Continuous Infusions:  0.9 % NaCl with KCl 40 mEq / L 100 mL/hr at 06/28/22 0959   azithromycin Stopped (06/27/22 2244)   cefTRIAXone (ROCEPHIN)  IV Stopped (06/27/22 2035)     LOS: 2 days   Time spent: [place], MD [facility] To contact the attending provider between 7A-7P or the covering provider during after hours 7P-7A, please log into the web site [email or web address] and access using universal [place] password for that web site. If you do not have the password, please call the hospital operator.  06/28/2022, 10:21 AM

## 2022-06-29 DIAGNOSIS — A419 Sepsis, unspecified organism: Secondary | ICD-10-CM | POA: Diagnosis not present

## 2022-06-29 DIAGNOSIS — J189 Pneumonia, unspecified organism: Secondary | ICD-10-CM | POA: Diagnosis not present

## 2022-06-29 LAB — CBC
HCT: 32.6 % — ABNORMAL LOW (ref 36.0–46.0)
Hemoglobin: 10.6 g/dL — ABNORMAL LOW (ref 12.0–15.0)
MCH: 28.9 pg (ref 26.0–34.0)
MCHC: 32.5 g/dL (ref 30.0–36.0)
MCV: 88.8 fL (ref 80.0–100.0)
Platelets: 310 10*3/uL (ref 150–400)
RBC: 3.67 MIL/uL — ABNORMAL LOW (ref 3.87–5.11)
RDW: 13.3 % (ref 11.5–15.5)
WBC: 8.5 10*3/uL (ref 4.0–10.5)
nRBC: 0 % (ref 0.0–0.2)

## 2022-06-29 LAB — BASIC METABOLIC PANEL
Anion gap: 6 (ref 5–15)
BUN: 8 mg/dL (ref 6–20)
CO2: 19 mmol/L — ABNORMAL LOW (ref 22–32)
Calcium: 7.4 mg/dL — ABNORMAL LOW (ref 8.9–10.3)
Chloride: 115 mmol/L — ABNORMAL HIGH (ref 98–111)
Creatinine, Ser: 0.57 mg/dL (ref 0.44–1.00)
GFR, Estimated: >60 mL/min (ref >60)
Glucose, Bld: 87 mg/dL (ref 70–99)
Potassium: 4.4 mmol/L (ref 3.5–5.1)
Sodium: 140 mmol/L (ref 135–145)

## 2022-06-29 MED ORDER — LOPERAMIDE HCL 2 MG PO CAPS
2.0000 mg | ORAL_CAPSULE | Freq: Four times a day (QID) | ORAL | Status: DC
  Administered 2022-06-29 (×3): 2 mg via ORAL
  Filled 2022-06-29 (×6): qty 1

## 2022-06-29 MED ORDER — GUAIFENESIN ER 600 MG PO TB12
1200.0000 mg | ORAL_TABLET | Freq: Two times a day (BID) | ORAL | Status: DC
  Administered 2022-06-29 – 2022-07-02 (×7): 1200 mg via ORAL
  Filled 2022-06-29 (×7): qty 2

## 2022-06-29 NOTE — Progress Notes (Signed)
PROGRESS NOTE   Gloria Bowman  [id number] DOB: Feb 25, 1971 DOA: 06/26/2022 PCP: Andria Frames, PA-C  Brief Narrative:  51 year old black female known history of right-sided breast cancer status post mastectomy 01/2016, left DCIS Rx as well-complication from lymphedema Left ICA aneurysm to be followed at [facility] for embolization Rheumatoid arthritis  Presented to urgent care for cough SOB malaise X 2 weeks Afebrile sats 90s CXR = basilar pneumonia and respiratory illness panel negative WBC 12 Lactic acid 2  Hospital-Problem based course  Severe sepsis on admission secondary to community-acquired pneumonia - Received bolus of fluids in the emergency room was placed on fluids and subsequently - Strep pneumo positive azithromycin discontinued but low-grade fevers therefore continue ceftriaxone  - Delsym as well as other mucolytic's ineffective, changed to Mucinex to see if this helps  AKI 2/2 sepsis with component renal acidosis - Saline lock 12/30 - AKI significantly improved  Severe hypokalemia - Resolved saline locked  Left ICA aneurysm - Follow-up with plans for embolization at [facility] is stable at this time  Rheumatoid arthritis -Hold Plaquenil 200 with specialized dosing at this time - Possibly contributes to some of her infectious causes  Bilateral breast cancer 2017 1996 - Outpatient surveillance - Holding anastrozole 1 daily  Diarrhea semiacute -C. difficile and stool panel negative - Lomotil was not given for some reason, t alert looks better more coherent still short of breath on oxygen Herefore we will schedule Imodium every 6 for 8 doses and see how things go  Anxiety - Resume Flexeril 5 3 times daily spasm, she usually takes Ativan 0.5 twice daily and we will see how she does on that  DVT prophylaxis: Heparin Code Status: Full Family Communication: None Disposition:  Status is: Inpatient Remains inpatient appropriate because:   Still  ill with low-grade fevers   Subjective:  Low-grade fever overnight still coughing No chest pain Still had about 2-3 diarrhea stools yesterday not given for some reason  Objective: Vitals:   06/28/22 1223 06/29/22 0408 06/29/22 0500 06/29/22 0836  BP: 122/61 129/84  (!) 146/76  Pulse: 89 (!) 105  99  Resp: (!) 31 20  18   Temp: 98.4 F (36.9 C) 100.2 F (37.9 C)  99.4 F (37.4 C)  TempSrc: Oral Oral  Oral  SpO2: 96% 94%  95%  Weight:   72.4 kg   Height:        Intake/Output Summary (Last 24 hours) at 06/29/2022 0919 Last data filed at 06/29/2022 [id number] Gross per 24 hour  Intake 3003.59 ml  Output --  Net 3003.59 ml    Filed Weights   06/27/22 0500 06/28/22 0500 06/29/22 0500  Weight: 68 kg 69.1 kg 72.4 kg    Examination:  Alert coherent no distress still on oxygen Decreased air entry posterolaterally Abdomen soft no rebound no guarding ROM intact no focal deficit Abdomen soft No lower extremity edema  Data Reviewed: personally reviewed   CBC    Component Value Date/Time   WBC 8.5 06/29/2022 0650   RBC 3.67 (L) 06/29/2022 0650   HGB 10.6 (L) 06/29/2022 0650   HCT 32.6 (L) 06/29/2022 0650   PLT 310 06/29/2022 0650   MCV 88.8 06/29/2022 0650   MCH 28.9 06/29/2022 0650   MCHC 32.5 06/29/2022 0650   RDW 13.3 06/29/2022 0650   LYMPHSABS 1.2 06/26/2022 2148   MONOABS 0.3 06/26/2022 2148   EOSABS 0.0 06/26/2022 2148   BASOSABS 0.0 06/26/2022 2148      Latest Ref  Rng & Units 06/29/2022    6:50 AM 06/28/2022    5:33 AM 06/27/2022    3:11 AM  CMP  Glucose 70 - 99 mg/dL 87  79  474   BUN 6 - 20 mg/dL 8  14  39   Creatinine 0.44 - 1.00 mg/dL 2.59  5.63  8.75   Sodium 135 - 145 mmol/L 140  140  141   Potassium 3.5 - 5.1 mmol/L 4.4  2.9  3.6   Chloride 98 - 111 mmol/L 115  114  106   CO2 22 - 32 mmol/L 19  21  25    Calcium 8.9 - 10.3 mg/dL 7.4  6.9  7.8      Radiology Studies: No results found.   Scheduled Meds:  guaiFENesin  1,200 mg Oral BID    heparin  5,000 Units Subcutaneous Q8H   potassium chloride  40 mEq Oral BID   sodium chloride flush  3 mL Intravenous Q12H   Continuous Infusions:  0.9 % NaCl with KCl 40 mEq / L 100 mL/hr at 06/29/22 0832   cefTRIAXone (ROCEPHIN)  IV Stopped (06/28/22 2116)     LOS: 3 days   Time spent: 71  59, MD [facility] To contact the attending provider between 7A-7P or the covering provider during after hours 7P-7A, please log into the web site [email or web address] and access using universal [place] password for that web site. If you do not have the password, please call the hospital operator.  06/29/2022, 9:19 AM

## 2022-06-29 NOTE — Progress Notes (Signed)
       CROSS COVER NOTE  NAME: Gloria Bowman MRN: [id number] DOB : June 21, 1971    Date of Service   06/29/2022   HPI/Events of Note   Patient requesting home medication, Ativan for anxiety, be restarted.  According to PDMP record, patient uses Ativan at home.  Prior hospital notes also mention Ativan use for anxiety.  Patient denies the use of Valium at home.  Valium discontinued.  Will restart Ativan.    Interventions/ Plan   Ativan 0.5 mg p.o. BID PRN       Anthoney Harada, DNP, ACNPC- AG [facility]

## 2022-06-30 ENCOUNTER — Inpatient Hospital Stay (HOSPITAL_COMMUNITY): Payer: BLUE CROSS/BLUE SHIELD

## 2022-06-30 DIAGNOSIS — M069 Rheumatoid arthritis, unspecified: Secondary | ICD-10-CM | POA: Diagnosis present

## 2022-06-30 DIAGNOSIS — A419 Sepsis, unspecified organism: Secondary | ICD-10-CM | POA: Diagnosis not present

## 2022-06-30 DIAGNOSIS — F411 Generalized anxiety disorder: Secondary | ICD-10-CM | POA: Diagnosis present

## 2022-06-30 DIAGNOSIS — J189 Pneumonia, unspecified organism: Secondary | ICD-10-CM | POA: Diagnosis not present

## 2022-06-30 DIAGNOSIS — K219 Gastro-esophageal reflux disease without esophagitis: Secondary | ICD-10-CM | POA: Diagnosis present

## 2022-06-30 LAB — CBC WITH DIFFERENTIAL/PLATELET
Abs Immature Granulocytes: 0.51 10*3/uL — ABNORMAL HIGH (ref 0.00–0.07)
Basophils Absolute: 0.1 10*3/uL (ref 0.0–0.1)
Basophils Relative: 1 %
Eosinophils Absolute: 0.1 10*3/uL (ref 0.0–0.5)
Eosinophils Relative: 1 %
HCT: 41.6 % (ref 36.0–46.0)
Hemoglobin: 13.2 g/dL (ref 12.0–15.0)
Immature Granulocytes: 5 %
Lymphocytes Relative: 13 %
Lymphs Abs: 1.3 10*3/uL (ref 0.7–4.0)
MCH: 28.7 pg (ref 26.0–34.0)
MCHC: 31.7 g/dL (ref 30.0–36.0)
MCV: 90.4 fL (ref 80.0–100.0)
Monocytes Absolute: 0.9 10*3/uL (ref 0.1–1.0)
Monocytes Relative: 9 %
Neutro Abs: 7.1 10*3/uL (ref 1.7–7.7)
Neutrophils Relative %: 71 %
Platelets: 344 10*3/uL (ref 150–400)
RBC: 4.6 MIL/uL (ref 3.87–5.11)
RDW: 13.2 % (ref 11.5–15.5)
WBC: 10 10*3/uL (ref 4.0–10.5)
nRBC: 0 % (ref 0.0–0.2)

## 2022-06-30 LAB — BASIC METABOLIC PANEL
Anion gap: 9 (ref 5–15)
BUN: 8 mg/dL (ref 6–20)
CO2: 19 mmol/L — ABNORMAL LOW (ref 22–32)
Calcium: 7.8 mg/dL — ABNORMAL LOW (ref 8.9–10.3)
Chloride: 109 mmol/L (ref 98–111)
Creatinine, Ser: 0.59 mg/dL (ref 0.44–1.00)
GFR, Estimated: >60 mL/min (ref >60)
Glucose, Bld: 102 mg/dL — ABNORMAL HIGH (ref 70–99)
Potassium: 4.3 mmol/L (ref 3.5–5.1)
Sodium: 137 mmol/L (ref 135–145)

## 2022-06-30 MED ORDER — PANTOPRAZOLE SODIUM 40 MG PO TBEC
40.0000 mg | DELAYED_RELEASE_TABLET | Freq: Every day | ORAL | Status: DC
  Administered 2022-07-01 – 2022-07-02 (×2): 40 mg via ORAL
  Filled 2022-06-30 (×3): qty 1

## 2022-06-30 MED ORDER — IOHEXOL 300 MG/ML  SOLN
75.0000 mL | Freq: Once | INTRAMUSCULAR | Status: AC | PRN
  Administered 2022-06-30: 75 mL via INTRAVENOUS

## 2022-06-30 NOTE — Progress Notes (Addendum)
PROGRESS NOTE    Gloria Bowman  WUJ:[id number] DOB: 08-Mar-1971 DOA: 06/26/2022 PCP: Virgilio Belling, PA-C     Brief Narrative:  Patient is a 51 year old African-American female with a history of right-sided breast cancer status postmastectomy on 01/2016, rheumatoid arthritis, and left ICA aneurysm followed by [facility] presenting to ED on 12/27 for cough and shortness of breath for 2 weeks.  Initially her lactic acid was 2, white count elevated at 12.9 and chest x-ray was concerning for basilar pneumonia.  She was started on Rocephin and azithromycin.  Strep pneumonia testing came back positive and she was continued on just Rocephin.   New events last 24 hours / Subjective: Patient feels her breathing is getting worse.  She is currently on 2 L of nasal cannula and feels short of breath every time she moves.  She continues to cough.  She continues to have low-grade fevers.  Appetite is poor.  Assessment & Plan:   Principal Problem:   Sepsis due to pneumonia (HCC)  Sepsis is improving, pneumonia/breathing is not  Add incentive spirometry  Continue Rocephin 2 g daily  Continue supplemental oxygen as needed  Consult physical therapy  Check CT chest  Active Problems:   Severe sepsis (HCC)  Resolved    AKI (acute kidney injury) (HCC)  Resolved    GAD (generalized anxiety disorder)  Ativan 0.5 mg twice daily as needed    GERD (gastroesophageal reflux disease)  Protonix 40 mg daily    Rheumatoid arthritis (HCC)  Pain control  DVT prophylaxis: Heparin Code Status: Full Family Communication: None at bedside; spoke with Mother Renea Ee on the phone to update Coming From: Home Disposition Plan: Home Barriers to Discharge: Clinical improvement  Consultants:  None   Procedures:  None   Antimicrobials:  Anti-infectives (From admission, onward)    Start     Dose/Rate Route Frequency Ordered Stop   06/26/22 2130  cefTRIAXone (ROCEPHIN) 2 g in sodium chloride 0.9 % 100 mL  IVPB        2 g 200 mL/hr over 30 Minutes Intravenous Every 24 hours 06/26/22 2120 07/01/22 2129   06/26/22 2130  azithromycin (ZITHROMAX) 500 mg in sodium chloride 0.9 % 250 mL IVPB  Status:  Discontinued        500 mg 250 mL/hr over 60 Minutes Intravenous Every 24 hours 06/26/22 2120 06/28/22 1228        Objective: Vitals:   06/29/22 1358 06/29/22 2022 06/30/22 0500 06/30/22 0522  BP: (!) 142/76 122/70  137/75  Pulse: (!) 103 (!) 106  (!) 103  Resp: Temp: 99.6 F (37.6 C) 100.1 F (37.8 C)  99.7 F (37.6 C)  TempSrc: Oral Oral  Oral  SpO2: 99% 96%  97%  Weight:   70.3 kg   Height:       No intake or output data in the 24 hours ending 06/30/22 0917 Filed Weights   06/28/22 0500 06/29/22 0500 06/30/22 0500  Weight: 69.1 kg 72.4 kg 70.3 kg    Examination:  General exam: Appears calm and comfortable  Respiratory system: Clear to auscultation. Respiratory effort normal. No respiratory distress. No conversational dyspnea.  Cardiovascular system: S1 & S2 heard, tachycardic, regular rhythm. No pedal edema. Gastrointestinal system: Abdomen is nondistended, soft and nontender. Normal bowel sounds heard. Central nervous system: Alert and oriented. No focal neurological deficits. Speech clear.  Extremities: Symmetric in appearance  Skin: No rashes, lesions or ulcers on exposed skin  Psychiatry:  Judgement and insight appear normal. Mood & affect appropriate.   Data Reviewed: I have personally reviewed following labs and imaging studies  CBC: Recent Labs  Lab 06/26/22 2148 06/27/22 0311 06/28/22 0533 06/29/22 0650 06/30/22 0600  WBC 12.9* 11.3* 9.1 8.5 10.0  NEUTROABS 11.3*  --   --   --  7.1  HGB 12.1 12.9 10.2* 10.6* 13.2  HCT 37.3 40.5 31.3* 32.6* 41.6  MCV 89.2 90.8 91.3 88.8 90.4  PLT 278 234 248 310 344   Basic Metabolic Panel: Recent Labs  Lab 06/26/22 2148 06/27/22 0311 06/28/22 0533 06/29/22 0650 06/30/22 0600  NA 137 141 140 140 137  K 3.2*  3.6 2.9* 4.4 4.3  CL 106 106 114* 115* 109  CO2 22 25 21* 19* 19*  GLUCOSE 112* 109* 79 87 102*  BUN 44* 39* 14 8 8   CREATININE 1.95* 1.49* 0.71 0.57 0.59  CALCIUM 7.7* 7.8* 6.9* 7.4* 7.8*  MG  --  1.9 1.8  --   --    GFR: Estimated Creatinine Clearance: 74.6 mL/min (by C-G formula based on SCr of 0.59 mg/dL).  Liver Function Tests: Recent Labs  Lab 06/26/22 2148  AST 27  ALT 19  ALKPHOS 38  BILITOT 1.0  PROT 7.1  ALBUMIN 3.1*   Coagulation Profile: Recent Labs  Lab 06/26/22 2148  INR 1.4*   Sepsis Labs: Recent Labs  Lab 06/26/22 2148 06/26/22 2351 06/27/22 0311 06/28/22 0533  PROCALCITON  --   --  39.06 17.76  LATICACIDVEN 2.0* 2.0*  --   --     Recent Results (from the past 240 hour(s))  Resp panel by RT-PCR (RSV, Flu A&B, Covid) Anterior Nasal Swab     Status: None   Collection Time: 06/26/22  9:30 PM   Specimen: Anterior Nasal Swab  Result Value Ref Range Status   SARS Coronavirus 2 by RT PCR NEGATIVE NEGATIVE Final    Comment: (NOTE) SARS-CoV-2 target nucleic acids are NOT DETECTED.  The SARS-CoV-2 RNA is generally detectable in upper respiratory specimens during the acute phase of infection. The lowest concentration of SARS-CoV-2 viral copies this assay can detect is 138 copies/mL. A negative result does not preclude SARS-Cov-2 infection and should not be used as the sole basis for treatment or other patient management decisions. A negative result may occur with  improper specimen collection/handling, submission of specimen other than nasopharyngeal swab, presence of viral mutation(s) within the areas targeted by this assay, and inadequate number of viral copies(<138 copies/mL). A negative result must be combined with clinical observations, patient history, and epidemiological information. The expected result is Negative.  Fact Sheet for Patients:  06/28/22  Fact Sheet for Healthcare Providers:   BloggerCourse.com  This test is no t yet approved or cleared by the [place] FDA and  has been authorized for detection and/or diagnosis of SARS-CoV-2 by FDA under an Emergency Use Authorization (EUA). This EUA will remain  in effect (meaning this test can be used) for the duration of the COVID-19 declaration under Section 564(b)(1) of the Act, 21 U.S.C.section 360bbb-3(b)(1), unless the authorization is terminated  or revoked sooner.       Influenza A by PCR NEGATIVE NEGATIVE Final   Influenza B by PCR NEGATIVE NEGATIVE Final    Comment: (NOTE) The Xpert Xpress SARS-CoV-2/FLU/RSV plus assay is intended as an aid in the diagnosis of influenza from Nasopharyngeal swab specimens and should not be used as a sole basis for treatment. Nasal washings and aspirates are  unacceptable for Xpert Xpress SARS-CoV-2/FLU/RSV testing.  Fact Sheet for Patients: BloggerCourse.com  Fact Sheet for Healthcare Providers: SeriousBroker[employer]it  This test is not yet approved or cleared by the [place] FDA and has been authorized for detection and/or diagnosis of SARS-CoV-2 by FDA under an Emergency Use Authorization (EUA). This EUA will remain in effect (meaning this test can be used) for the duration of the COVID-19 declaration under Section 564(b)(1) of the Act, 21 U.S.C. section 360bbb-3(b)(1), unless the authorization is terminated or revoked.     Resp Syncytial Virus by PCR NEGATIVE NEGATIVE Final    Comment: (NOTE) Fact Sheet for Patients: BloggerCourse.com  Fact Sheet for Healthcare Providers: [employer]ious[employer]it  This test is not yet approved or cleared by the [place] FDA and has been authorized for detection and/or diagnosis of SARS-CoV-2 by FDA under an Emergency Use Authorization (EUA). This EUA will remain in effect (meaning this test can be used) for  the duration of the COVID-19 declaration under Section 564(b)(1) of the Act, 21 U.S.C. section 360bbb-3(b)(1), unless the authorization is terminated or revoked.  Performed at [facility], [address], [place], [place] [address]   Blood Culture (routine x 2)     Status: None (Preliminary result)   Collection Time: 06/26/22  9:48 PM   Specimen: BLOOD  Result Value Ref Range Status   Specimen Description   Final    BLOOD BLOOD LEFT HAND Performed at [facility], [address], [place], [place] [address]    Special Requests   Final    BOTTLES DRAWN AEROBIC AND ANAEROBIC Blood Culture results may not be optimal due to an inadequate volume of blood received in culture bottles Performed at [facility], [address] [place], [place] [address]    Culture   Final    NO GROWTH 3 DAYS Performed at [facility], [address], [place], [place] [address]    Report Status PENDING  Incomplete  Blood Culture (routine x 2)     Status: None (Preliminary result)   Collection Time: 06/26/22 10:25 PM   Specimen: BLOOD  Result Value Ref Range Status   Specimen Description   Final    BLOOD BLOOD LEFT FOREARM Performed at [facility], [address] [place], [place] [address]    Special Requests   Final    BOTTLES DRAWN AEROBIC AND ANAEROBIC Blood Culture adequate volume Performed at [facility], [address] [place], [place] [address]    Culture   Final    NO GROWTH 3 DAYS Performed at [facility], [address], [place], [place] [address]    Report Status PENDING  Incomplete  Urine Culture     Status: Abnormal   Collection Time: 06/26/22 11:06 PM   Specimen: In/Out Cath Urine  Result Value Ref Range Status   Specimen Description   Final    IN/OUT CATH URINE Performed at [facility], [address] [place], [place] [address]    Special Requests    Final    NONE Performed at [facility], [address] [place], [place] [address]    Culture MULTIPLE SPECIES PRESENT, SUGGEST RECOLLECTION (A)  Final   Report Status 06/28/2022 FINAL  Final  C Difficile Quick Screen w PCR reflex     Status: None   Collection Time: 06/27/22 12:50 PM   Specimen: STOOL  Result Value Ref Range Status  C Diff antigen NEGATIVE NEGATIVE Final   C Diff toxin NEGATIVE NEGATIVE Final   C Diff interpretation No C. difficile detected.  Final    Comment: Performed at [facility], [address], [place], [place] [address]      Scheduled Meds:  guaiFENesin  1,200 mg Oral BID   heparin  5,000 Units Subcutaneous Q8H   loperamide  2 mg Oral Q6H   sodium chloride flush  3 mL Intravenous Q12H   Continuous Infusions:  cefTRIAXone (ROCEPHIN)  IV 2 g (06/29/22 2118)     LOS: 4 days    Time spent: 30 minutes   Sharlene Dory, DO [facility] 06/30/2022, 9:17 AM   Available via Epic secure chat 7am-7pm After these hours, please refer to coverage provider listed on [email or web address]

## 2022-07-01 DIAGNOSIS — J189 Pneumonia, unspecified organism: Secondary | ICD-10-CM | POA: Diagnosis not present

## 2022-07-01 DIAGNOSIS — A419 Sepsis, unspecified organism: Secondary | ICD-10-CM | POA: Diagnosis not present

## 2022-07-01 LAB — BASIC METABOLIC PANEL
Anion gap: 10 (ref 5–15)
BUN: 9 mg/dL (ref 6–20)
CO2: 21 mmol/L — ABNORMAL LOW (ref 22–32)
Calcium: 7.5 mg/dL — ABNORMAL LOW (ref 8.9–10.3)
Chloride: 106 mmol/L (ref 98–111)
Creatinine, Ser: 0.63 mg/dL (ref 0.44–1.00)
GFR, Estimated: >60 mL/min (ref >60)
Glucose, Bld: 99 mg/dL (ref 70–99)
Potassium: 3.3 mmol/L — ABNORMAL LOW (ref 3.5–5.1)
Sodium: 137 mmol/L (ref 135–145)

## 2022-07-01 LAB — CBC
HCT: 32.4 % — ABNORMAL LOW (ref 36.0–46.0)
Hemoglobin: 10.6 g/dL — ABNORMAL LOW (ref 12.0–15.0)
MCH: 28.8 pg (ref 26.0–34.0)
MCHC: 32.7 g/dL (ref 30.0–36.0)
MCV: 88 fL (ref 80.0–100.0)
Platelets: 465 10*3/uL — ABNORMAL HIGH (ref 150–400)
RBC: 3.68 MIL/uL — ABNORMAL LOW (ref 3.87–5.11)
RDW: 13 % (ref 11.5–15.5)
WBC: 12.1 10*3/uL — ABNORMAL HIGH (ref 4.0–10.5)
nRBC: 0 % (ref 0.0–0.2)

## 2022-07-01 LAB — MRSA NEXT GEN BY PCR, NASAL: MRSA by PCR Next Gen: NOT DETECTED

## 2022-07-01 MED ORDER — VANCOMYCIN HCL 1500 MG/300ML IV SOLN
1500.0000 mg | INTRAVENOUS | Status: DC
  Administered 2022-07-02: 1500 mg via INTRAVENOUS
  Filled 2022-07-01: qty 300

## 2022-07-01 MED ORDER — PREDNISONE 20 MG PO TABS
40.0000 mg | ORAL_TABLET | Freq: Every day | ORAL | Status: DC
  Administered 2022-07-01 – 2022-07-02 (×2): 40 mg via ORAL
  Filled 2022-07-01 (×2): qty 2

## 2022-07-01 MED ORDER — SODIUM CHLORIDE 0.9 % IV SOLN
2.0000 g | INTRAVENOUS | Status: DC
  Administered 2022-07-01: 2 g via INTRAVENOUS
  Filled 2022-07-01: qty 20

## 2022-07-01 MED ORDER — GUAIFENESIN-DM 100-10 MG/5ML PO SYRP
5.0000 mL | ORAL_SOLUTION | ORAL | Status: DC | PRN
  Administered 2022-07-01: 5 mL via ORAL
  Filled 2022-07-01: qty 10

## 2022-07-01 MED ORDER — VANCOMYCIN HCL 1500 MG/300ML IV SOLN
1500.0000 mg | Freq: Once | INTRAVENOUS | Status: AC
  Administered 2022-07-01: 1500 mg via INTRAVENOUS
  Filled 2022-07-01: qty 300

## 2022-07-01 MED ORDER — POTASSIUM CHLORIDE CRYS ER 20 MEQ PO TBCR
20.0000 meq | EXTENDED_RELEASE_TABLET | Freq: Two times a day (BID) | ORAL | Status: AC
  Administered 2022-07-01 (×2): 20 meq via ORAL
  Filled 2022-07-01 (×2): qty 1

## 2022-07-01 MED ORDER — PHENOL 1.4 % MT LIQD: 1.0000 | OROMUCOSAL | Status: DC | PRN

## 2022-07-01 MED ORDER — VANCOMYCIN HCL 1250 MG/250ML IV SOLN: 1250.0000 mg | Freq: Two times a day (BID) | INTRAVENOUS | Status: DC

## 2022-07-01 NOTE — Progress Notes (Signed)
Pharmacy Antibiotic Note  Gloria Bowman is a 52 y.o. female admitted on 06/26/2022 with pneumonia.  Pt has received 5 days of IV ceftriaxone. MD adding vancomycin. Pharmacy has been consulted for vancomycin dosing.  Today, 07/01/22 WBC slightly elevated SCr WNL, rounded to 0.8 mg/dL for dosing calculations  Plan: Continue ceftriaxone 2 g IV daily Vancomycin 1500 mg IV q24h for estimated AUC of 523. Goal vancomycin AUC 400-550.  MRSA PCR ordered. If negative and the only suspected source of infection is respiratory, would recommend discontinuing vancomycin.  Monitor renal function. Check vancomycin levels at steady state as needed.   Height: 5\' 1"  (154.9 cm) Weight: 70.4 kg (155 lb 4.8 oz) IBW/kg (Calculated) : 47.8  Temp (24hrs), Avg:99.4 F (37.4 C), Min:98.2 F (36.8 C), Max:100.7 F (38.2 C)  Recent Labs  Lab 06/26/22 2148 06/26/22 2351 06/27/22 0311 06/28/22 0533 06/29/22 0650 06/30/22 0600 07/01/22 0614  WBC 12.9*  --  11.3* 9.1 8.5 10.0 12.1*  CREATININE 1.95*  --  1.49* 0.71 0.57 0.59 0.63  LATICACIDVEN 2.0* 2.0*  --   --   --   --   --     Estimated Creatinine Clearance: 74.6 mL/min (by C-G formula based on SCr of 0.63 mg/dL).    Allergies  Allergen Reactions   Bactrim [Sulfamethoxazole-Trimethoprim]     Lenis Noon, PharmD 07/01/2022 11:21 AM

## 2022-07-01 NOTE — Progress Notes (Signed)
PT Cancellation Note  Patient Details Name: Gloria Bowman MRN: [id number] DOB: January 25, 1971   Cancelled Treatment:    Reason Eval/Treat Not Completed:  Attempted PT eval-pt declined to participate. Will check back another time as schedule allows.    [facility]  Office: [phone]

## 2022-07-01 NOTE — Progress Notes (Signed)
PROGRESS NOTE    Gloria Bowman  [id number] DOB: 08-23-1970 DOA: 06/26/2022 PCP: Andria Frames, PA-C     Brief Narrative:  Patient is a 52 year old African-American female with a history of right-sided breast cancer status postmastectomy on 01/2016, rheumatoid arthritis, and left ICA aneurysm followed by [facility] presenting to ED on 12/27 for cough and shortness of breath for 2 weeks. Initially her lactic acid was 2, white count elevated at 12.9 and chest x-ray was concerning for basilar pneumonia. She was started on Rocephin and azithromycin. Strep pneumonia testing came back positive and she was continued on just Rocephin.   New events last 24 hours / Subjective: Patient's breathing is very limited with anything she does.  She gets short of breath with speaking.  She feels she is starting to wheeze.  She does report fevers overnight.  CT chest showed multifocal pneumonia.  She has leukocytosis now of 12.1.  Assessment & Plan:   Principal Problem:   Sepsis due to pneumonia ([place])             Sepsis is improving, pneumonia/breathing is still not             Incentive spirometry             Continue Rocephin 2 g daily  Add vancomycin given worsening white count  Started prednisone burst 40 mg daily for 5 total days ending on 07/05/2022             Continue supplemental oxygen as needed             Consult physical therapy             Check CT chest   Active Problems:   Severe sepsis ([place])             Treatment as above     AKI (acute kidney injury) ([place])             Resolved    Hypokalemia  Replace, monitor BMP     GAD (generalized anxiety disorder)             Ativan 0.5 mg twice daily as needed     GERD (gastroesophageal reflux disease)             Protonix 40 mg daily     Rheumatoid arthritis ([place])             Pain control   DVT prophylaxis: Heparin Code Status: Full Family Communication: None at bedside; spoke with Mother Estill Bamberg on the phone to update and  prefers to be updated daily Coming From: Home Disposition Plan: Home Barriers to Discharge: Clinical improvement   Consultants:  None       Procedures:  None   Antimicrobials:  Anti-infectives (From admission, onward)    Start     Dose/Rate Route Frequency Ordered Stop   07/01/22 1100  vancomycin (VANCOREADY) IVPB 1500 mg/300 mL        1,500 mg 150 mL/hr over 120 Minutes Intravenous  Once 07/01/22 0959     07/01/22 1030  vancomycin (VANCOREADY) IVPB 1250 mg/250 mL  Status:  Discontinued        1,250 mg 166.7 mL/hr over 90 Minutes Intravenous Every 12 hours 07/01/22 0938 07/01/22 0958   06/26/22 2130  cefTRIAXone (ROCEPHIN) 2 g in sodium chloride 0.9 % 100 mL IVPB        2 g 200 mL/hr over 30 Minutes Intravenous Every 24 hours 06/26/22 2120 06/30/22  2200   06/26/22 2130  azithromycin (ZITHROMAX) 500 mg in sodium chloride 0.9 % 250 mL IVPB  Status:  Discontinued        500 mg 250 mL/hr over 60 Minutes Intravenous Every 24 hours 06/26/22 2120 06/28/22 1228        Objective: Vitals:   07/01/22 0600 07/01/22 0800 07/01/22 0826 07/01/22 0900  BP:   119/71   Pulse:   88   Resp: (!) 29 (!) 24 (!) 24 (!) 23  Temp:   98.3 F (36.8 C)   TempSrc:   Oral   SpO2:   97%   Weight:      Height:        Intake/Output Summary (Last 24 hours) at 07/01/2022 1104 Last data filed at 07/01/2022 1051 Gross per 24 hour  Intake 126.49 ml  Output --  Net 126.49 ml   Filed Weights   06/29/22 0500 06/30/22 0500 07/01/22 0500  Weight: 72.4 kg 70.3 kg 70.4 kg    Examination:  General exam: Appears calm and comfortable  Respiratory system: Exp wheezes heard at bases b/l. Respiratory effort normal. No respiratory distress. +conversational dyspnea.  Cardiovascular system: S1 & S2 heard, RRR. No murmurs. No pedal edema. Gastrointestinal system: Abdomen is nondistended, soft and nontender. Normal bowel sounds heard. Central nervous system: Alert and oriented. No focal neurological deficits.  Speech clear.  Extremities: Symmetric in appearance  Skin: No rashes, lesions or ulcers on exposed skin  Psychiatry: Judgement and insight appear normal. Mood & affect appropriate.   Data Reviewed: I have personally reviewed following labs and imaging studies  CBC: Recent Labs  Lab 06/26/22 2148 06/27/22 0311 06/28/22 0533 06/29/22 0650 06/30/22 0600 07/01/22 0614  WBC 12.9* 11.3* 9.1 8.5 10.0 12.1*  NEUTROABS 11.3*  --   --   --  7.1  --   HGB 12.1 12.9 10.2* 10.6* 13.2 10.6*  HCT 37.3 40.5 31.3* 32.6* 41.6 32.4*  MCV 89.2 90.8 91.3 88.8 90.4 88.0  PLT 278 234 248 310 344 124*   Basic Metabolic Panel: Recent Labs  Lab 06/27/22 0311 06/28/22 0533 06/29/22 0650 06/30/22 0600 07/01/22 0614  NA 141 140 140 137 137  K 3.6 2.9* 4.4 4.3 3.3*  CL 106 114* 115* 109 106  CO2 25 21* 19* 19* 21*  GLUCOSE 109* 79 87 102* 99  BUN 39* 14 8 8 9   CREATININE 1.49* 0.71 0.57 0.59 0.63  CALCIUM 7.8* 6.9* 7.4* 7.8* 7.5*  MG 1.9 1.8  --   --   --    GFR: Estimated Creatinine Clearance: 74.6 mL/min (by C-G formula based on SCr of 0.63 mg/dL).  Liver Function Tests: Recent Labs  Lab 06/26/22 2148  AST 27  ALT 19  ALKPHOS 38  BILITOT 1.0  PROT 7.1  ALBUMIN 3.1*   Coagulation Profile: Recent Labs  Lab 06/26/22 2148  INR 1.4*   Sepsis Labs: Recent Labs  Lab 06/26/22 2148 06/26/22 2351 06/27/22 0311 06/28/22 0533  PROCALCITON  --   --  39.06 17.76  LATICACIDVEN 2.0* 2.0*  --   --     Recent Results (from the past 240 hour(s))  Resp panel by RT-PCR (RSV, Flu A&B, Covid) Anterior Nasal Swab     Status: None   Collection Time: 06/26/22  9:30 PM   Specimen: Anterior Nasal Swab  Result Value Ref Range Status   SARS Coronavirus 2 by RT PCR NEGATIVE NEGATIVE Final    Comment: (NOTE) SARS-CoV-2 target nucleic acids are  NOT DETECTED.  The SARS-CoV-2 RNA is generally detectable in upper respiratory specimens during the acute phase of infection. The lowest concentration  of SARS-CoV-2 viral copies this assay can detect is 138 copies/mL. A negative result does not preclude SARS-Cov-2 infection and should not be used as the sole basis for treatment or other patient management decisions. A negative result may occur with  improper specimen collection/handling, submission of specimen other than nasopharyngeal swab, presence of viral mutation(s) within the areas targeted by this assay, and inadequate number of viral copies(<138 copies/mL). A negative result must be combined with clinical observations, patient history, and epidemiological information. The expected result is Negative.  Fact Sheet for Patients:  EntrepreneurPulse.com.au  Fact Sheet for Healthcare Providers:  IncredibleEmployment.be  This test is no t yet approved or cleared by the [place] FDA and  has been authorized for detection and/or diagnosis of SARS-CoV-2 by FDA under an Emergency Use Authorization (EUA). This EUA will remain  in effect (meaning this test can be used) for the duration of the COVID-19 declaration under Section 564(b)(1) of the Act, 21 U.S.C.section 360bbb-3(b)(1), unless the authorization is terminated  or revoked sooner.       Influenza A by PCR NEGATIVE NEGATIVE Final   Influenza B by PCR NEGATIVE NEGATIVE Final    Comment: (NOTE) The Xpert Xpress SARS-CoV-2/FLU/RSV plus assay is intended as an aid in the diagnosis of influenza from Nasopharyngeal swab specimens and should not be used as a sole basis for treatment. Nasal washings and aspirates are unacceptable for Xpert Xpress SARS-CoV-2/FLU/RSV testing.  Fact Sheet for Patients: EntrepreneurPulse.com.au  Fact Sheet for Healthcare Providers: IncredibleEmployment.be  This test is not yet approved or cleared by the [place] FDA and has been authorized for detection and/or diagnosis of SARS-CoV-2 by FDA under an Emergency Use  Authorization (EUA). This EUA will remain in effect (meaning this test can be used) for the duration of the COVID-19 declaration under Section 564(b)(1) of the Act, 21 U.S.C. section 360bbb-3(b)(1), unless the authorization is terminated or revoked.     Resp Syncytial Virus by PCR NEGATIVE NEGATIVE Final    Comment: (NOTE) Fact Sheet for Patients: EntrepreneurPulse.com.au  Fact Sheet for Healthcare Providers: IncredibleEmployment.be  This test is not yet approved or cleared by the [place] FDA and has been authorized for detection and/or diagnosis of SARS-CoV-2 by FDA under an Emergency Use Authorization (EUA). This EUA will remain in effect (meaning this test can be used) for the duration of the COVID-19 declaration under Section 564(b)(1) of the Act, 21 U.S.C. section 360bbb-3(b)(1), unless the authorization is terminated or revoked.  Performed at [facility], [place] [address], [place], [place] [address]   Blood Culture (routine x 2)     Status: None (Preliminary result)   Collection Time: 06/26/22  9:48 PM   Specimen: BLOOD  Result Value Ref Range Status   Specimen Description   Final    BLOOD BLOOD LEFT HAND Performed at [facility] [address], [place], [place] [address]    Special Requests   Final    BOTTLES DRAWN AEROBIC AND ANAEROBIC Blood Culture results may not be optimal due to an inadequate volume of blood received in culture bottles Performed at [place] [address] [place], [place] [address]    Culture   Final    NO GROWTH 4 DAYS Performed at [facility], [place] [address] [place], [place] [address]    Report Status  PENDING  Incomplete  Blood Culture (routine x 2)     Status: None (Preliminary result)   Collection Time: 06/26/22 10:25 PM   Specimen: BLOOD  Result Value Ref Range Status   Specimen Description   Final    BLOOD BLOOD LEFT  FOREARM Performed at [facility] [address], [place], [place] [address]    Special Requests   Final    BOTTLES DRAWN AEROBIC AND ANAEROBIC Blood Culture adequate volume Performed at [facility] [address] [place], [place] [address]    Culture   Final    NO GROWTH 4 DAYS Performed at [facility], [place] [address] [place], [place] [address]    Report Status PENDING  Incomplete  Urine Culture     Status: Abnormal   Collection Time: 06/26/22 11:06 PM   Specimen: In/Out Cath Urine  Result Value Ref Range Status   Specimen Description   Final    IN/OUT CATH URINE Performed at [facility] [address] [place], [place] [address]    Special Requests   Final    NONE Performed at [facility], [place] [address], [place], [place] [address]    Culture MULTIPLE SPECIES PRESENT, SUGGEST RECOLLECTION (A)  Final   Report Status 06/28/2022 FINAL  Final  C Difficile Quick Screen w PCR reflex     Status: None   Collection Time: 06/27/22 12:50 PM   Specimen: STOOL  Result Value Ref Range Status   C Diff antigen NEGATIVE NEGATIVE Final   C Diff toxin NEGATIVE NEGATIVE Final   C Diff interpretation No C. difficile detected.  Final    Comment: Performed at [facility], [place] [address] [place], [place] [address]      Radiology Studies: CT CHEST W CONTRAST  Result Date: 06/30/2022 CLINICAL DATA:  Non resolving pneumonia, dyspnea EXAM: CT CHEST WITH CONTRAST TECHNIQUE: Multidetector CT imaging of the chest was performed during intravenous contrast administration. RADIATION DOSE REDUCTION: This exam was performed according to the departmental dose-optimization program which includes automated exposure control, adjustment of the mA and/or kV according to patient size and/or use of iterative reconstruction technique. CONTRAST:  62mL OMNIPAQUE IOHEXOL 300 MG/ML  SOLN COMPARISON:  Previous  studies including chest radiograph done on 06/26/2022, CT done on 03/15/2021 FINDINGS: Cardiovascular: Heart is enlarged in size. There is homogeneous enhancement in thoracic aorta. Scattered calcifications are seen in thoracic aorta. There are no intraluminal filling defects in central pulmonary artery branches. Small peripheral branches are obscured by infiltrates in both lungs. Mediastinum/Nodes: There are slightly enlarged lymph nodes in mediastinum and both hilar regions. Lungs/Pleura: There is large infiltrate involving most of the left lower lobe with air bronchogram. There is moderate sized infiltrate in right lower lobe. There is patchy infiltrate in the superior segment of right lower lobe. There are small patchy infiltrates seen right upper lobe. Small patchy infiltrates are seen in left upper lobe in parahilar region. There is increase in interstitial markings in the subpleural location in right middle lobe, possibly suggesting scarring from previous radiation. There is evidence of right mastectomy with reconstruction in both breasts. Small bilateral pleural effusions are seen, more so on the left side. There is no pneumothorax. Upper Abdomen: There is 7 mm cyst in the anterior aspect of liver with no interval change. Musculoskeletal: No acute findings are seen. IMPRESSION: There is no evidence of thoracic aortic dissection. There is no evidence of central pulmonary  embolism. Large alveolar infiltrate is seen in left lower lobe suggesting atelectasis/pneumonia. There is moderate sized infiltrate in right lower lobe. There are small patchy infiltrates in both upper lobes, more so on the right side. Findings suggest multifocal pneumonia. Small bilateral pleural effusions are seen, more so on the left side. Other findings as described in the body of the report. Electronically Signed   By: Elmer Picker M.D.   On: 06/30/2022 18:38     Scheduled Meds:  guaiFENesin  1,200 mg Oral BID   heparin   5,000 Units Subcutaneous Q8H   pantoprazole  40 mg Oral Daily   potassium chloride  20 mEq Oral BID   predniSONE  40 mg Oral Q breakfast   sodium chloride flush  3 mL Intravenous Q12H   Continuous Infusions:  vancomycin 150 mL/hr at 07/01/22 1051     LOS: 5 days    Time spent: 35 minutes   Shelda Pal, DO [facility] 07/01/2022, 11:04 AM   Available via Epic secure chat 7am-7pm After these hours, please refer to coverage provider listed on [email or web address]

## 2022-07-02 LAB — BASIC METABOLIC PANEL
Anion gap: 8 (ref 5–15)
BUN: 11 mg/dL (ref 6–20)
CO2: 22 mmol/L (ref 22–32)
Calcium: 8 mg/dL — ABNORMAL LOW (ref 8.9–10.3)
Chloride: 108 mmol/L (ref 98–111)
Creatinine, Ser: 0.59 mg/dL (ref 0.44–1.00)
GFR, Estimated: >60 mL/min (ref >60)
Glucose, Bld: 148 mg/dL — ABNORMAL HIGH (ref 70–99)
Potassium: 4.2 mmol/L (ref 3.5–5.1)
Sodium: 138 mmol/L (ref 135–145)

## 2022-07-02 LAB — CBC
HCT: 32.7 % — ABNORMAL LOW (ref 36.0–46.0)
Hemoglobin: 10.6 g/dL — ABNORMAL LOW (ref 12.0–15.0)
MCH: 29 pg (ref 26.0–34.0)
MCHC: 32.4 g/dL (ref 30.0–36.0)
MCV: 89.3 fL (ref 80.0–100.0)
Platelets: 562 10*3/uL — ABNORMAL HIGH (ref 150–400)
RBC: 3.66 MIL/uL — ABNORMAL LOW (ref 3.87–5.11)
RDW: 13.2 % (ref 11.5–15.5)
WBC: 10.6 10*3/uL — ABNORMAL HIGH (ref 4.0–10.5)
nRBC: 0 % (ref 0.0–0.2)

## 2022-07-02 LAB — CULTURE, BLOOD (ROUTINE X 2)
Culture: NO GROWTH
Culture: NO GROWTH
Special Requests: ADEQUATE

## 2022-07-02 MED ORDER — FLUCONAZOLE 150 MG PO TABS
150.0000 mg | ORAL_TABLET | Freq: Once | ORAL | Status: AC
  Administered 2022-07-02: 150 mg via ORAL
  Filled 2022-07-02: qty 1

## 2022-07-02 MED ORDER — PREDNISONE 20 MG PO TABS: 40.0000 mg | ORAL_TABLET | Freq: Every day | ORAL | 0 refills | Status: AC

## 2022-07-02 MED ORDER — SODIUM CHLORIDE 0.9 % IV SOLN
2.0000 g | Freq: Once | INTRAVENOUS | Status: AC
  Administered 2022-07-02: 2 g via INTRAVENOUS
  Filled 2022-07-02: qty 20

## 2022-07-02 NOTE — Evaluation (Signed)
Physical Therapy Evaluation-1x Patient Details Name: Gloria Bowman MRN: [id number] DOB: 06/19/1971 Today's Date: 07/02/2022  History of Present Illness  52 yo female admitted with sepsis 2* pna. Hx of RA  Clinical Impression  On eval, pt was Mod Ind with mobility. Upon my arrival, pt was performing ADLs in bathroom-self care. She walked ~185 feet without a device-no LOB. O2 95% on RA at rest, 92% on RA with ambulation. Dyspnea 2/4. No acute PT need. 1x eval. Will sign off.        Recommendations for follow up therapy are one component of a multi-disciplinary discharge planning process, led by the attending physician.  Recommendations may be updated based on patient status, additional functional criteria and insurance authorization.  Follow Up Recommendations No PT follow up      Assistance Recommended at Discharge None  Patient can return home with the following       Equipment Recommendations None recommended by PT  Recommendations for Other Services       Functional Status Assessment Patient has had a recent decline in their functional status and demonstrates the ability to make significant improvements in function in a reasonable and predictable amount of time.     Precautions / Restrictions Precautions Precautions: None Restrictions Weight Bearing Restrictions: No      Mobility  Bed Mobility Overal bed mobility: Modified Independent                  Transfers Overall transfer level: Modified independent                      Ambulation/Gait Ambulation/Gait assistance: Modified independent (Device/Increase time) Gait Distance (Feet): 185 Feet Assistive device: None Gait Pattern/deviations: Step-through pattern, Decreased stride length       General Gait Details: No LOB. O2 92% on RA, dyspnea 2/4  Stairs            Wheelchair Mobility    Modified Rankin (Stroke Patients Only)       Balance Overall balance assessment: Mild deficits  observed, not formally tested                                           Pertinent Vitals/Pain Pain Assessment Pain Assessment: No/denies pain    Home Living Family/patient expects to be discharged to:: Private residence Living Arrangements: Children   Type of Home: House           Home Equipment: None      Prior Function Prior Level of Function : Independent/Modified Independent                     Hand Dominance        Extremity/Trunk Assessment   Upper Extremity Assessment Upper Extremity Assessment: Overall WFL for tasks assessed    Lower Extremity Assessment Lower Extremity Assessment: Overall WFL for tasks assessed    Cervical / Trunk Assessment Cervical / Trunk Assessment: Normal  Communication   Communication: No difficulties  Cognition Arousal/Alertness: Awake/alert Behavior During Therapy: WFL for tasks assessed/performed Overall Cognitive Status: Within Functional Limits for tasks assessed                                          General Comments  Exercises     Assessment/Plan    PT Assessment Patient does not need any further PT services  PT Problem List         PT Treatment Interventions      PT Goals (Current goals can be found in the Care Plan section)  Acute Rehab PT Goals Patient Stated Goal: home soon. PT Goal Formulation: All assessment and education complete, DC therapy    Frequency       Co-evaluation               AM-PAC PT "6 Clicks" Mobility  Outcome Measure Help needed turning from your back to your side while in a flat bed without using bedrails?: None Help needed moving from lying on your back to sitting on the side of a flat bed without using bedrails?: None Help needed moving to and from a bed to a chair (including a wheelchair)?: None Help needed standing up from a chair using your arms (e.g., wheelchair or bedside chair)?: None Help needed to walk in  hospital room?: None Help needed climbing 3-5 steps with a railing? : None 6 Click Score: 24    End of Session   Activity Tolerance: Patient tolerated treatment well Patient left: in bed;with call bell/phone within reach        Time: 6160-7371 PT Time Calculation (min) (ACUTE ONLY): 19 min   Charges:   PT Evaluation $PT Eval Low Complexity: 1 Low             Doreatha Massed, PT Acute Rehabilitation  Office: [phone]
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
## 2022-07-02 NOTE — Plan of Care (Signed)
  Problem: Activity: Goal: Ability to tolerate increased activity will improve Outcome: Progressing   Problem: Respiratory: Goal: Ability to maintain adequate ventilation will improve Outcome: Progressing   Problem: Clinical Measurements: Goal: Ability to maintain clinical measurements within normal limits will improve Outcome: Progressing   

## 2022-07-02 NOTE — Discharge Summary (Addendum)
Gloria Bowman [id number] DOB: [birth year] DOA: 06/26/2022  PCP: Park Meo T, PA-C  Admit date: 06/26/2022 Discharge date: 07/02/2022  Time spent: 35 minutes  Recommendations for Outpatient Follow-up:  Pcp f/u     Discharge Diagnoses:  Principal Problem:   Sepsis due to pneumonia [facility]) Active Problems:   Severe sepsis ([place])   AKI (acute kidney injury) ([place])   GAD (generalized anxiety disorder)   GERD (gastroesophageal reflux disease)   Rheumatoid arthritis ([place])   Discharge Condition: improved  Diet recommendation: heart healthy  Filed Weights   06/29/22 0500 06/30/22 0500 07/01/22 0500  Weight: 72.4 kg 70.3 kg 70.4 kg    History of present illness:  From admission h and p  Gloria Bowman is a 52 y.o. female with medical history significant for cancer of the right breast, rheumatoid arthritis, and left ICA aneurysm who presents to the emergency department with shortness of breath, cough, aches, and fatigue.   Patient reports that she went to an urgent care today for evaluation of cough, shortness of breath, aches, and malaise that have been present for approximately 2 weeks.  She was said to be febrile at the urgent care and had a chest x-ray concerning for pneumonia.  She was sent to the ED for management.  She denies abdominal pain, nausea, or vomiting but has had some loose stools in the last 1 to 2 days.  She has had a loss of appetite throughout this illness.   She is being followed by neurosurgery at [facility] for left ICA aneurysm with plans for possible embolization.   Hospital Course:   Hx RA, presenting with cough and malaise, found to have multifocal pneumonia. Treated with ceftriaxone and azithromycin. Covid/flu/rsv negative. Urine strep antigen positive. Treated with 7 days ceftriaxone. Had persistent elevated fevers so CT of chest was obtained on 12/31 which showed ongoing LLL infiltrate and RLL infiltrate. Suspect some degree of atelectasis  so incentive spirometry was advised. Now no fever for > 24 hours and patient reports she is feeling much better. Has been weaned off oxygen and ambulated with PT without difficulty. Steroids were started 2 days ago and patient thinks those has helped so will write for 3 more days. Patient also reports symptoms of a vaginal yeast infection so a one-time dose of oral diflucan was given prior to discharge. Advised holding plaquenil until symptoms resolve.  Procedures: none   Consultations: none  Discharge Exam: Vitals:   07/02/22 0515 07/02/22 1358  BP: 114/73 (!) 145/77  Pulse: 84 82  Resp: 19 20  Temp: 97.7 F (36.5 C) 97.8 F (36.6 C)  SpO2: 98% 100%    General: NAD Cardiovascular: RRR Respiratory: rales at bases  Discharge Instructions   Discharge Instructions     Diet - low sodium heart healthy   Complete by: As directed    Increase activity slowly   Complete by: As directed       Allergies as of 07/02/2022       Reactions   Bactrim [sulfamethoxazole-trimethoprim]         Medication List     STOP taking these medications    hydroxychloroquine 200 MG tablet Commonly known as: PLAQUENIL   metroNIDAZOLE 500 MG tablet Commonly known as: FLAGYL   traMADol 50 MG tablet Commonly known as: ULTRAM       TAKE these medications    anastrozole 1 MG tablet Commonly known as: ARIMIDEX Take 1 mg by mouth daily.   aspirin EC  81 MG tablet Take 81 mg by mouth daily.   cyclobenzaprine 5 MG tablet Commonly known as: FLEXERIL Take 5 mg by mouth 3 (three) times daily as needed for muscle spasms.   diazepam 5 MG tablet Commonly known as: VALIUM Take 5 mg by mouth every 6 (six) hours as needed for anxiety.   ibuprofen 800 MG tablet Commonly known as: ADVIL TAKE 1 TABLET BY MOUTH EVERY 8 HOURS AS NEEDED What changed:  reasons to take this Another medication with the same name was removed. Continue taking this medication, and follow the directions you see  here.   omeprazole 40 MG capsule Commonly known as: PRILOSEC Take 40 mg by mouth daily.   predniSONE 20 MG tablet Commonly known as: DELTASONE Take 2 tablets (40 mg total) by mouth daily with breakfast. Start taking on: July 03, 2022   Vitamin D-1000 Max St 25 MCG (1000 UT) tablet Generic drug: Cholecalciferol Take 2,000 Units by mouth daily.       Allergies  Allergen Reactions   Bactrim [Sulfamethoxazole-Trimethoprim]     Follow-up Information     Park Meo T, PA-C Follow up.   Specialty: Physician Assistant Contact information: [place] [address] [phone]                  The results of significant diagnostics from this hospitalization (including imaging, microbiology, ancillary and laboratory) are listed below for reference.    Significant Diagnostic Studies: CT CHEST W CONTRAST  Result Date: 06/30/2022 CLINICAL DATA:  Non resolving pneumonia, dyspnea EXAM: CT CHEST WITH CONTRAST TECHNIQUE: Multidetector CT imaging of the chest was performed during intravenous contrast administration. RADIATION DOSE REDUCTION: This exam was performed according to the departmental dose-optimization program which includes automated exposure control, adjustment of the mA and/or kV according to patient size and/or use of iterative reconstruction technique. CONTRAST:  11mL OMNIPAQUE IOHEXOL 300 MG/ML  SOLN COMPARISON:  Previous studies including chest radiograph done on 06/26/2022, CT done on 03/15/2021 FINDINGS: Cardiovascular: Heart is enlarged in size. There is homogeneous enhancement in thoracic aorta. Scattered calcifications are seen in thoracic aorta. There are no intraluminal filling defects in central pulmonary artery branches. Small peripheral branches are obscured by infiltrates in both lungs. Mediastinum/Nodes: There are slightly enlarged lymph nodes in mediastinum and both hilar regions. Lungs/Pleura: There is large infiltrate involving  most of the left lower lobe with air bronchogram. There is moderate sized infiltrate in right lower lobe. There is patchy infiltrate in the superior segment of right lower lobe. There are small patchy infiltrates seen right upper lobe. Small patchy infiltrates are seen in left upper lobe in parahilar region. There is increase in interstitial markings in the subpleural location in right middle lobe, possibly suggesting scarring from previous radiation. There is evidence of right mastectomy with reconstruction in both breasts. Small bilateral pleural effusions are seen, more so on the left side. There is no pneumothorax. Upper Abdomen: There is 7 mm cyst in the anterior aspect of liver with no interval change. Musculoskeletal: No acute findings are seen. IMPRESSION: There is no evidence of thoracic aortic dissection. There is no evidence of central pulmonary embolism. Large alveolar infiltrate is seen in left lower lobe suggesting atelectasis/pneumonia. There is moderate sized infiltrate in right lower lobe. There are small patchy infiltrates in both upper lobes, more so on the right side. Findings suggest multifocal pneumonia. Small bilateral pleural effusions are seen, more so on the left side. Other findings as described in  the body of the report. Electronically Signed   By: Elmer Picker M.D.   On: 06/30/2022 18:38   DG Chest Port 1 View  Result Date: 06/26/2022 CLINICAL DATA:  Questionable sepsis.  Fever and body aches. EXAM: PORTABLE CHEST 1 VIEW COMPARISON:  Chest CT 03/15/2021 FINDINGS: The cardiomediastinal silhouette and vascular pattern are normal. There is calcification of the transverse aorta. There is haziness in the lung bases which could be due to pneumonia or due to the patient's overlying breast implants. A standing PA and lateral study in full inspiration is recommended. The sulci are sharp. The mid to upper lung fields are clear. Mild osteopenia. IMPRESSION: Haziness in the lung bases  which could be due to pneumonia or due to the patient's overlying breast implants. A standing PA and lateral study in full inspiration is recommended. Electronically Signed   By: Telford Nab M.D.   On: 06/26/2022 21:40    Microbiology: Recent Results (from the past 240 hour(s))  Resp panel by RT-PCR (RSV, Flu A&B, Covid) Anterior Nasal Swab     Status: None   Collection Time: 06/26/22  9:30 PM   Specimen: Anterior Nasal Swab  Result Value Ref Range Status   SARS Coronavirus 2 by RT PCR NEGATIVE NEGATIVE Final    Comment: (NOTE) SARS-CoV-2 target nucleic acids are NOT DETECTED.  The SARS-CoV-2 RNA is generally detectable in upper respiratory specimens during the acute phase of infection. The lowest concentration of SARS-CoV-2 viral copies this assay can detect is 138 copies/mL. A negative result does not preclude SARS-Cov-2 infection and should not be used as the sole basis for treatment or other patient management decisions. A negative result may occur with  improper specimen collection/handling, submission of specimen other than nasopharyngeal swab, presence of viral mutation(s) within the areas targeted by this assay, and inadequate number of viral copies(<138 copies/mL). A negative result must be combined with clinical observations, patient history, and epidemiological information. The expected result is Negative.  Fact Sheet for Patients:  EntrepreneurPulse.com.au  Fact Sheet for Healthcare Providers:  IncredibleEmployment.be  This test is no t yet approved or cleared by the [place] FDA and  has been authorized for detection and/or diagnosis of SARS-CoV-2 by FDA under an Emergency Use Authorization (EUA). This EUA will remain  in effect (meaning this test can be used) for the duration of the COVID-19 declaration under Section 564(b)(1) of the Act, 21 U.S.C.section 360bbb-3(b)(1), unless the authorization is terminated  or revoked  sooner.       Influenza A by PCR NEGATIVE NEGATIVE Final   Influenza B by PCR NEGATIVE NEGATIVE Final    Comment: (NOTE) The Xpert Xpress SARS-CoV-2/FLU/RSV plus assay is intended as an aid in the diagnosis of influenza from Nasopharyngeal swab specimens and should not be used as a sole basis for treatment. Nasal washings and aspirates are unacceptable for Xpert Xpress SARS-CoV-2/FLU/RSV testing.  Fact Sheet for Patients: EntrepreneurPulse.com.au  Fact Sheet for Healthcare Providers: IncredibleEmployment.be  This test is not yet approved or cleared by the [place] FDA and has been authorized for detection and/or diagnosis of SARS-CoV-2 by FDA under an Emergency Use Authorization (EUA). This EUA will remain in effect (meaning this test can be used) for the duration of the COVID-19 declaration under Section 564(b)(1) of the Act, 21 U.S.C. section 360bbb-3(b)(1), unless the authorization is terminated or revoked.     Resp Syncytial Virus by PCR NEGATIVE NEGATIVE Final    Comment: (NOTE) Fact Sheet for Patients: EntrepreneurPulse.com.au  Fact Sheet for Healthcare Providers: IncredibleEmployment.be  This test is not yet approved or cleared by the [place] FDA and has been authorized for detection and/or diagnosis of SARS-CoV-2 by FDA under an Emergency Use Authorization (EUA). This EUA will remain in effect (meaning this test can be used) for the duration of the COVID-19 declaration under Section 564(b)(1) of the Act, 21 U.S.C. section 360bbb-3(b)(1), unless the authorization is terminated or revoked.  Performed at [facility], [place] [address] [place], [place] [address]   Blood Culture (routine x 2)     Status: None   Collection Time: 06/26/22  9:48 PM   Specimen: BLOOD  Result Value Ref Range Status   Specimen Description   Final    BLOOD BLOOD LEFT HAND Performed at  [place] [address] [place], [place] [address]    Special Requests   Final    BOTTLES DRAWN AEROBIC AND ANAEROBIC Blood Culture results may not be optimal due to an inadequate volume of blood received in culture bottles Performed at [place] [address], [place], [place] [address]    Culture   Final    NO GROWTH 5 DAYS Performed at [facility], [place] [address], [place], [place] [address]    Report Status 07/02/2022 FINAL  Final  Blood Culture (routine x 2)     Status: None   Collection Time: 06/26/22 10:25 PM   Specimen: BLOOD  Result Value Ref Range Status   Specimen Description   Final    BLOOD BLOOD LEFT FOREARM Performed at [place] [address], [place], [place] [address]    Special Requests   Final    BOTTLES DRAWN AEROBIC AND ANAEROBIC Blood Culture adequate volume Performed at [place] [address], [place], [place] [address]    Culture   Final    NO GROWTH 5 DAYS Performed at [facility], [place] [address] [place], [place] [address]    Report Status 07/02/2022 FINAL  Final  Urine Culture     Status: Abnormal   Collection Time: 06/26/22 11:06 PM   Specimen: In/Out Cath Urine  Result Value Ref Range Status   Specimen Description   Final    IN/OUT CATH URINE Performed at [place] [address] [place], [place] [address]    Special Requests   Final    NONE Performed at [facility], [place] [address] [place], [place] [address]    Culture MULTIPLE SPECIES PRESENT, SUGGEST RECOLLECTION (A)  Final   Report Status 06/28/2022 FINAL  Final  C Difficile Quick Screen w PCR reflex     Status: None   Collection Time: 06/27/22 12:50 PM   Specimen: STOOL  Result Value Ref Range Status   C Diff antigen NEGATIVE NEGATIVE Final   C Diff toxin NEGATIVE NEGATIVE Final   C Diff interpretation No C. difficile detected.   Final    Comment: Performed at [facility], [place] [address], [place], [place] [address]  MRSA Next Gen by PCR, Nasal     Status: None   Collection Time: 07/01/22  5:22 PM   Specimen: Nasal Mucosa; Nasal Swab  Result Value Ref Range Status   MRSA by PCR Next Gen NOT DETECTED NOT DETECTED Final    Comment: (NOTE) The GeneXpert MRSA Assay (FDA approved for NASAL specimens only), is one component of a comprehensive MRSA colonization surveillance  program. It is not intended to diagnose MRSA infection nor to guide or monitor treatment for MRSA infections. Test performance is not FDA approved in patients less than 83 years old. Performed at [facility], [place] [address], [place], [place] [address]      Labs: Basic Metabolic Panel: Recent Labs  Lab 06/27/22 0311 06/28/22 0533 06/29/22 0650 06/30/22 0600 07/01/22 0614 07/02/22 0458  NA 141 140 140 137 137 138  K 3.6 2.9* 4.4 4.3 3.3* 4.2  CL 106 114* 115* 109 106 108  CO2 25 21* 19* 19* 21* 22  GLUCOSE 109* 79 87 102* 99 148*  BUN 39* 14 8 8 9 11   CREATININE 1.49* 0.71 0.57 0.59 0.63 0.59  CALCIUM 7.8* 6.9* 7.4* 7.8* 7.5* 8.0*  MG 1.9 1.8  --   --   --   --    Liver Function Tests: Recent Labs  Lab 06/26/22 2148  AST 27  ALT 19  ALKPHOS 38  BILITOT 1.0  PROT 7.1  ALBUMIN 3.1*   No results for input(s): "LIPASE", "AMYLASE" in the last 168 hours. No results for input(s): "AMMONIA" in the last 168 hours. CBC: Recent Labs  Lab 06/26/22 2148 06/27/22 0311 06/28/22 0533 06/29/22 0650 06/30/22 0600 07/01/22 0614 07/02/22 0458  WBC 12.9*   < > 9.1 8.5 10.0 12.1* 10.6*  NEUTROABS 11.3*  --   --   --  7.1  --   --   HGB 12.1   < > 10.2* 10.6* 13.2 10.6* 10.6*  HCT 37.3   < > 31.3* 32.6* 41.6 32.4* 32.7*  MCV 89.2   < > 91.3 88.8 90.4 88.0 89.3  PLT 278   < > 248 310 344 465* 562*   < > = values in this interval not displayed.   Cardiac Enzymes: No results for input(s): "CKTOTAL",  "CKMB", "CKMBINDEX", "TROPONINI" in the last 168 hours. BNP: BNP (last 3 results) No results for input(s): "BNP" in the last 8760 hours.  ProBNP (last 3 results) No results for input(s): "PROBNP" in the last 8760 hours.  CBG: No results for input(s): "GLUCAP" in the last 168 hours.     Signed:  Desma Maxim MD.  [facility] 07/02/2022, 4:58 PM

## 2022-07-02 NOTE — Plan of Care (Signed)
  Problem: Activity: Goal: Ability to tolerate increased activity will improve 07/02/2022 1852 by Marlou Sa, RN Outcome: Adequate for Discharge 07/02/2022 1436 by Marlou Sa, RN Outcome: Progressing   Problem: Clinical Measurements: Goal: Ability to maintain a body temperature in the normal range will improve Outcome: Adequate for Discharge   Problem: Respiratory: Goal: Ability to maintain adequate ventilation will improve 07/02/2022 1852 by Marlou Sa, RN Outcome: Adequate for Discharge 07/02/2022 1436 by Marlou Sa, RN Outcome: Progressing Goal: Ability to maintain a clear airway will improve Outcome: Adequate for Discharge   Problem: Education: Goal: Knowledge of General Education information will improve Description: Including pain rating scale, medication(s)/side effects and non-pharmacologic comfort measures Outcome: Adequate for Discharge   Problem: Health Behavior/Discharge Planning: Goal: Ability to manage health-related needs will improve Outcome: Adequate for Discharge   Problem: Clinical Measurements: Goal: Ability to maintain clinical measurements within normal limits will improve 07/02/2022 1852 by Marlou Sa, RN Outcome: Adequate for Discharge 07/02/2022 1436 by Marlou Sa, RN Outcome: Progressing Goal: Will remain free from infection Outcome: Adequate for Discharge Goal: Diagnostic test results will improve Outcome: Adequate for Discharge Goal: Respiratory complications will improve Outcome: Adequate for Discharge Goal: Cardiovascular complication will be avoided Outcome: Adequate for Discharge   Problem: Activity: Goal: Risk for activity intolerance will decrease Outcome: Adequate for Discharge   Problem: Nutrition: Goal: Adequate nutrition will be maintained Outcome: Adequate for Discharge   Problem: Coping: Goal: Level of anxiety will decrease Outcome: Adequate for Discharge   Problem: Elimination: Goal: Will not experience complications  related to bowel motility Outcome: Adequate for Discharge Goal: Will not experience complications related to urinary retention Outcome: Adequate for Discharge   Problem: Pain Managment: Goal: General experience of comfort will improve Outcome: Adequate for Discharge   Problem: Safety: Goal: Ability to remain free from injury will improve Outcome: Adequate for Discharge   Problem: Skin Integrity: Goal: Risk for impaired skin integrity will decrease Outcome: Adequate for Discharge

## 2022-07-03 LAB — LEGIONELLA PNEUMOPHILA SEROGP 1 UR AG: L. pneumophila Serogp 1 Ur Ag: NEGATIVE

## 2022-09-02 IMAGING — CT CT RENAL STONE PROTOCOL
2 of 4 series · 16 of 46 positions shown, 18 images · non-contrast
Comparison: None.

CLINICAL DATA: Right-sided flank

EXAM:
CT ABDOMEN AND PELVIS WITHOUT CONTRAST
TECHNIQUE: Multidetector CT imaging of the abdomen and pelvis was performed
following the standard protocol without IV contrast.

[Series 2: axial st · axial · 0.65mm/px · z∈[+1120,+1500]mm · 13 of 86 slices shown, 15 images]
[im 5/86  soft-tissue]
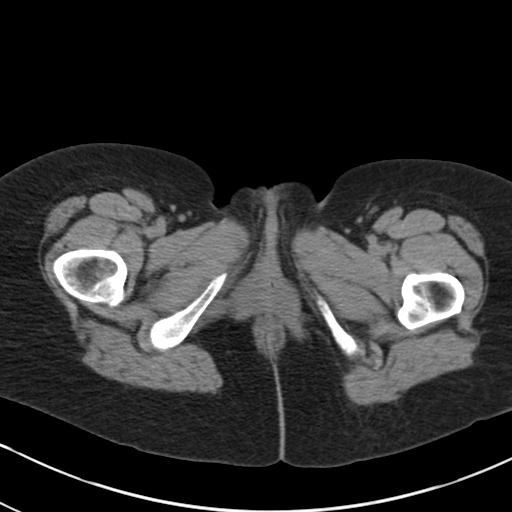
[im 5/86  bone]
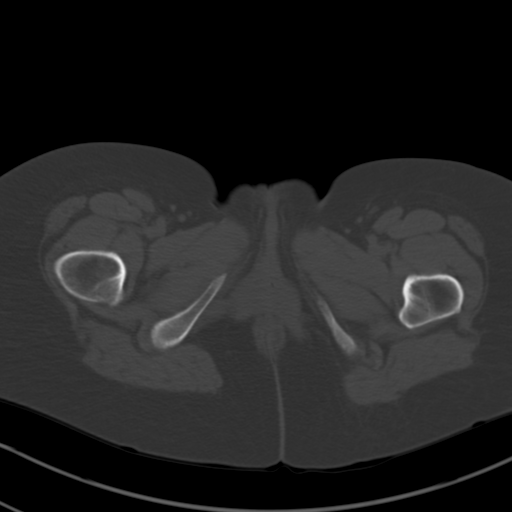
[im 10/86  soft-tissue]
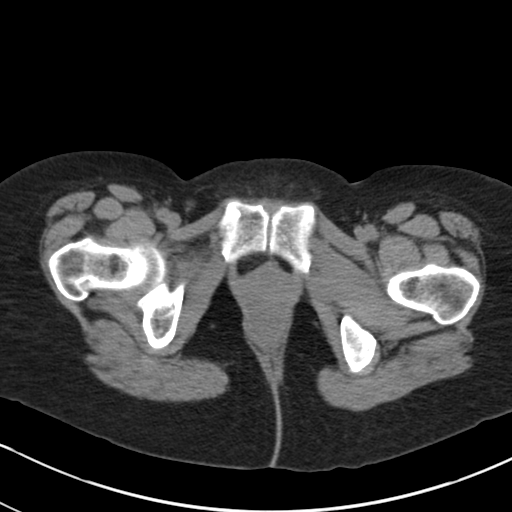
[im 19/86  soft-tissue]
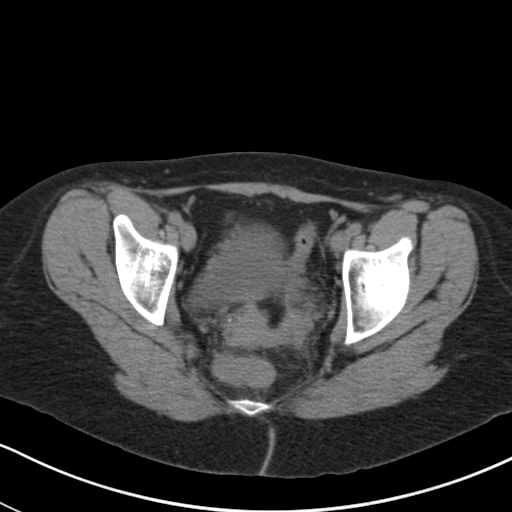
[im 24/86  soft-tissue]
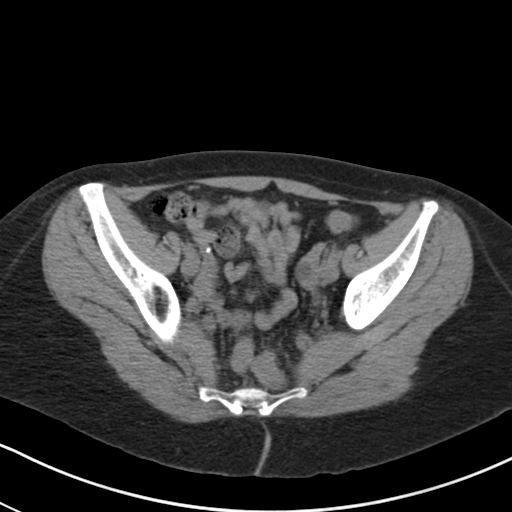
[im 29/86  soft-tissue]
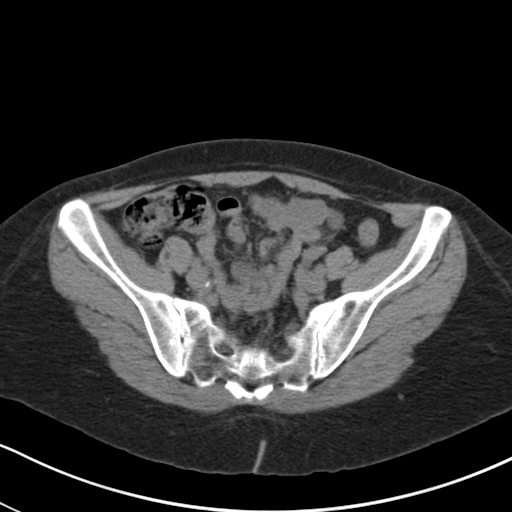
[im 38/86  soft-tissue]
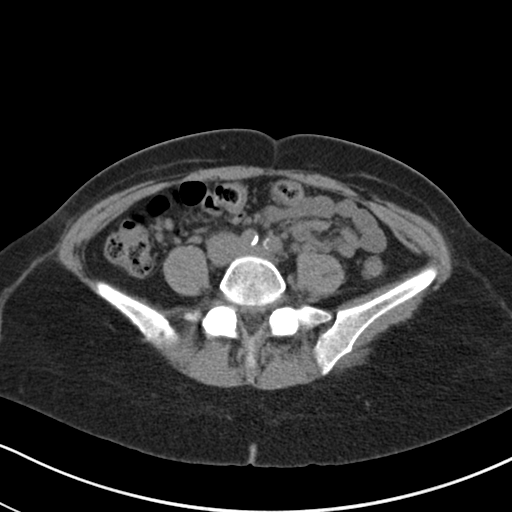
[im 43/86  soft-tissue]
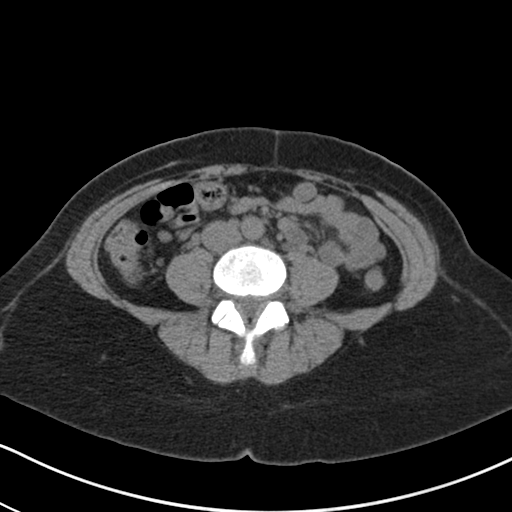
[im 48/86  soft-tissue]
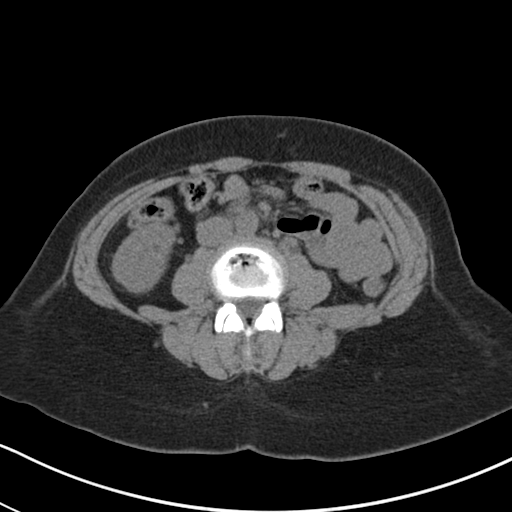
[im 57/86  soft-tissue]
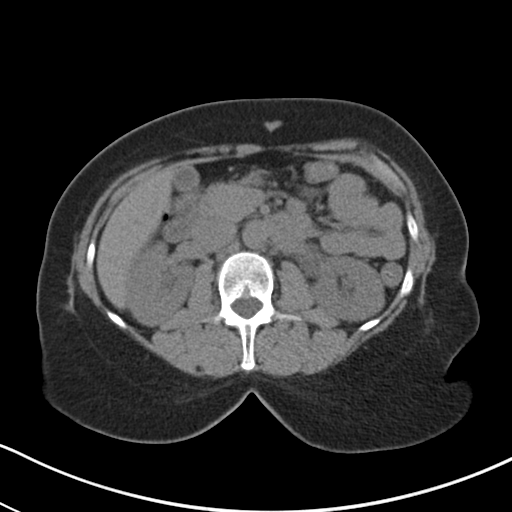
[im 57/86  bone]
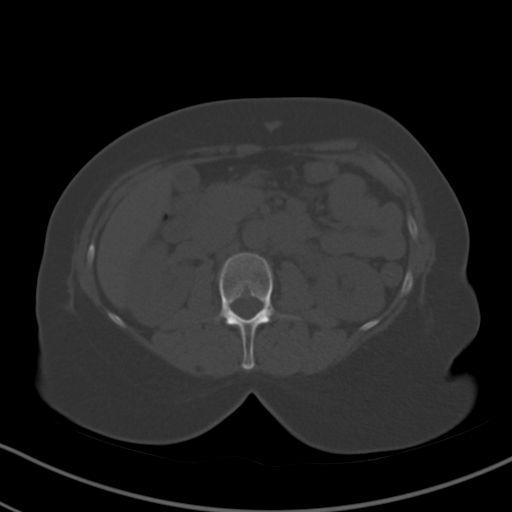
[im 62/86  soft-tissue]
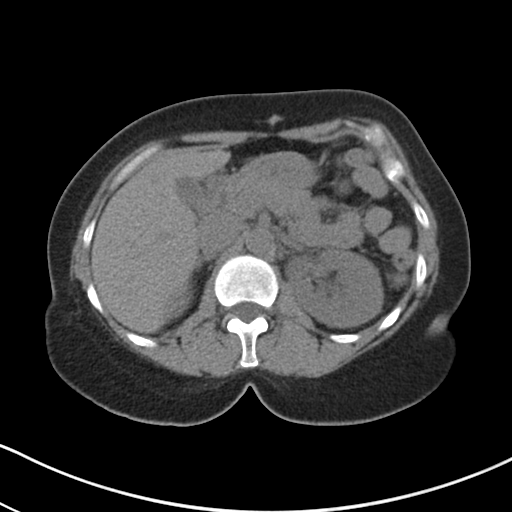
[im 67/86  soft-tissue]
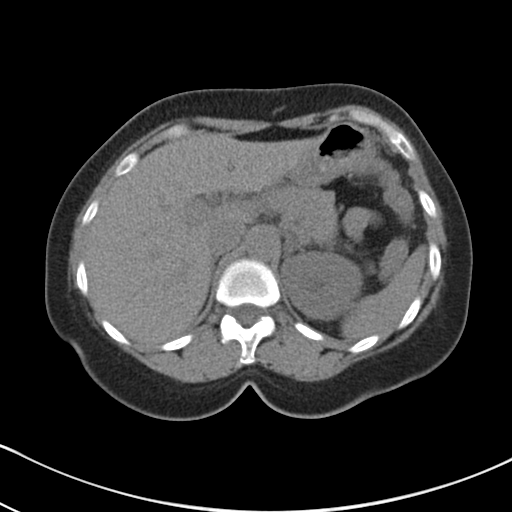
[im 76/86  soft-tissue]
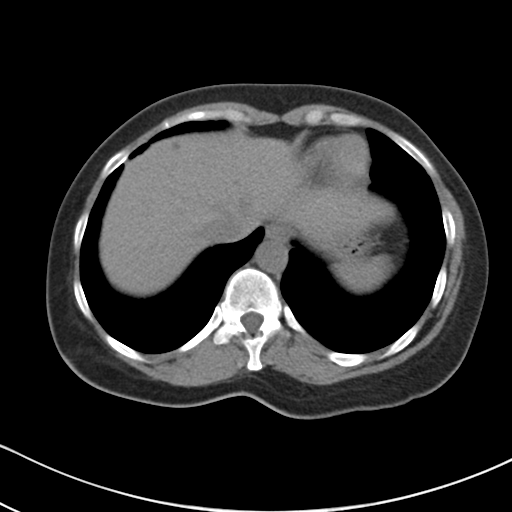
[im 81/86  soft-tissue]
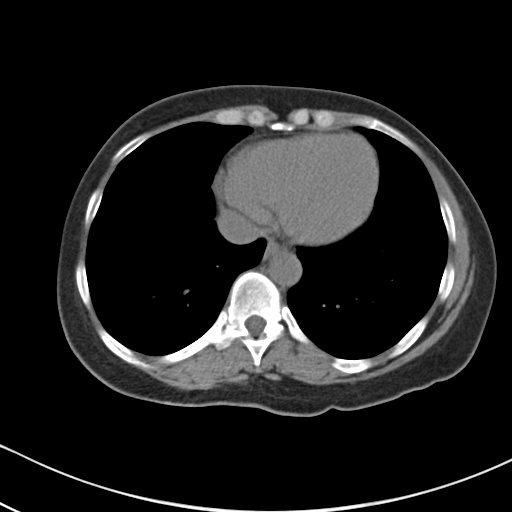

[Series 5: coronal · coronal · 0.76mm/px · 3 of 133 slices shown]
[im 45/133  soft-tissue]
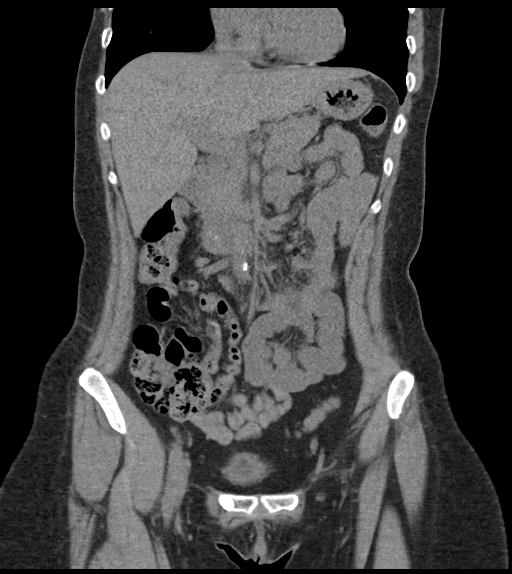
[im 59/133  soft-tissue]
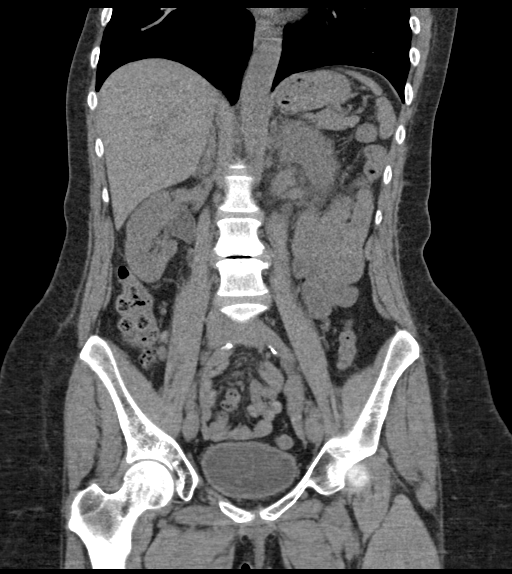
[im 74/133  soft-tissue]
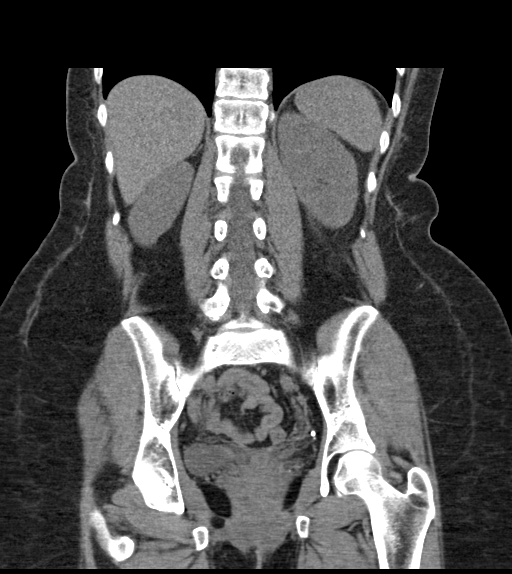

[16 of 46 positions shown; findings below may reference images not displayed]

FINDINGS: Lower chest: The visualized heart size within normal limits. No
pericardial fluid/thickening.

No hiatal hernia.

The visualized portions of the lungs are clear.

Hepatobiliary: There is a small hypodense lesion seen in the
anterior right liver lobe measuring cm. No evidence of calcified
gallstones or biliary ductal dilatation.

Pancreas:  Unremarkable.  No surrounding inflammatory changes.

Spleen: Normal in size. Although limited due to the lack of
intravenous contrast, normal in appearance.

Adrenals/Urinary Tract: Both adrenal glands appear normal. There is
a punctate calcifications seen in the lower pole of the right
kidney. Mild right-sided pelviectasis is noted. A punctate calculus
seen in the posterior bladder measuring 2 mm. No left-sided renal or
collecting system calculi are noted. Bladder is unremarkable.

Stomach/Bowel: The stomach, small bowel, and colon are normal in
appearance. No inflammatory changes or obstructive findings.
appendix is normal.

Vascular/Lymphatic: There are no enlarged abdominal or pelvic lymph
nodes. Scattered mild calcifications seen in the aorta bi-iliac
bifurcation.

Reproductive: The patient is status post hysterectomy. No adnexal
masses or collections seen.

Other: No evidence of abdominal wall mass or hernia.

Musculoskeletal: No acute or significant osseous findings.
IMPRESSION: Mild right pelviectasis which could be from recently passed stone.

There is a punctate 2 mm calculus in the posterior left bladder.

Punctate nonobstructing right renal calculus

Aortic Atherosclerosis ([id number]).

## 2023-01-13 LAB — COLOGUARD: COLOGUARD: NEGATIVE

## 2024-01-28 ENCOUNTER — Emergency Department (HOSPITAL_COMMUNITY)
Admission: EM | Admit: 2024-01-28 | Discharge: 2024-01-28 | Disposition: A | Discharge status: Home / Self Care | Attending: Emergency Medicine | Admitting: Emergency Medicine

## 2024-01-28 ENCOUNTER — Other Ambulatory Visit: Payer: Self-pay

## 2024-01-28 ENCOUNTER — Encounter (HOSPITAL_COMMUNITY): Payer: Self-pay

## 2024-01-28 DIAGNOSIS — R040 Epistaxis: Secondary | ICD-10-CM | POA: Diagnosis present

## 2024-01-28 MED ORDER — OXYMETAZOLINE HCL 0.05 % NA SOLN
1.0000 | Freq: Once | NASAL | Status: AC
  Administered 2024-01-28: 1 via NASAL
  Filled 2024-01-28: qty 30

## 2024-01-28 MED ORDER — LIDOCAINE-EPINEPHRINE 1 %-1:100000 IJ SOLN
10.0000 mL | Freq: Once | INTRAMUSCULAR | Status: AC
  Administered 2024-01-28: 10 mL
  Filled 2024-01-28: qty 1

## 2024-01-28 MED ORDER — LIDOCAINE-EPINEPHRINE (PF) 2 %-1:200000 IJ SOLN
INTRAMUSCULAR | Status: AC
  Filled 2024-01-28: qty 20

## 2024-01-28 MED ORDER — TRANEXAMIC ACID FOR EPISTAXIS: 500.0000 mg | Freq: Once | TOPICAL | Status: DC

## 2024-01-28 MED ORDER — TRANEXAMIC ACID FOR EPISTAXIS
500.0000 mg | Freq: Once | TOPICAL | Status: AC
  Administered 2024-01-28: 500 mg via TOPICAL

## 2024-01-28 NOTE — Discharge Instructions (Signed)
 It was a pleasure caring for you today in the emergency department.  If nosebleed recurs recommend you blow your nose, spray Afrin in your nose and apply firm pressure to the soft part of your nose for 15 to 20 minutes while leaning forward.  If bleeding continues after this recommend you seek medical care.  Recommend use a humidifier in your bedroom, you can apply a small amount of Neosporin or bacitracin inside your nostril to keep it moist.  Avoid sticking anything in your nose  Please return to the emergency department for any worsening or worrisome symptoms.

## 2024-01-28 NOTE — ED Provider Notes (Signed)
 [place] [facility] AT [facility] Provider Note  CSN: [id number] Arrival date & time: 01/28/24 1018  Chief Complaint(s) Epistaxis  HPI Gloria Bowman is a 53 y.o. female with past medical history as below, significant for RA, sacral fracture, GAD, intracranial aneurysm who presents to the ED with complaint of epistaxis  Patient reports that she began having epistaxis this morning, spontaneously, denies any digital trauma.  No facial injuries.  Reports that she takes Plavix.  She attempted to apply direct pressure but nosebleed did not resolve.  She feels like it is going down the back of her throat.  No vomiting or nausea.  No chest pain, dyspnea, syncope  Past Medical History Past Medical History:  Diagnosis Date   RA (rheumatoid arthritis) (HCC)    Patient Active Problem List   Diagnosis Date Noted   GAD (generalized anxiety disorder) 06/30/2022   GERD (gastroesophageal reflux disease) 06/30/2022   Rheumatoid arthritis (HCC) 06/30/2022   Severe sepsis (HCC) 06/27/2022   AKI (acute kidney injury) (HCC) 06/27/2022   Sepsis due to pneumonia (HCC) 06/26/2022   Closed nondisplaced zone III fracture of sacrum (HCC) 02/18/2017   Home Medication(s) Prior to Admission medications   Medication Sig Start Date End Date Taking? Authorizing Provider  anastrozole (ARIMIDEX) 1 MG tablet Take 1 mg by mouth daily. 11/12/16   [provider]  Cholecalciferol (VITAMIN D-1000 MAX ST) 25 MCG (1000 UT) tablet Take 2,000 Units by mouth daily.    [provider]  cyclobenzaprine  (FLEXERIL ) 5 MG tablet Take 5 mg by mouth 3 (three) times daily as needed for muscle spasms.    [provider]  diazepam  (VALIUM ) 5 MG tablet Take 5 mg by mouth every 6 (six) hours as needed for anxiety.    [provider]  ibuprofen  (ADVIL ,MOTRIN ) 800 MG tablet TAKE 1 TABLET BY MOUTH EVERY 8 HOURS AS NEEDED Patient taking differently: Take 800 mg by mouth every 8 (eight)  hours as needed for mild pain. 04/22/18   Jerri Kay CHRISTELLA, MD  omeprazole (PRILOSEC) 40 MG capsule Take 40 mg by mouth daily. 05/27/22   [provider]  predniSONE  (DELTASONE ) 20 MG tablet Take 2 tablets (40 mg total) by mouth daily with breakfast. 07/03/22   Wouk, Devaughn Sayres, MD                                                                                                                                    Past Surgical History Past Surgical History:  Procedure Laterality Date   ABDOMINAL HYSTERECTOMY     MASTECTOMY     Family History History reviewed. No pertinent family history.  Social History Social History   Tobacco Use   Smoking status: Never   Smokeless tobacco: Never  Substance Use Topics   Alcohol use: No   Drug use: No   Allergies Bactrim [sulfamethoxazole-trimethoprim]  Review of Systems A thorough review of  systems was obtained and all systems are negative except as noted in the HPI and PMH.   Physical Exam Vital Signs  I have reviewed the triage vital signs BP 131/73   Pulse (!) 113   Temp 98.3 F (36.8 C) (Axillary)   Resp 18   Wt 67.6 kg   SpO2 99%   BMI 28.15 kg/m  Physical Exam Vitals and nursing note reviewed.  Constitutional:      General: She is not in acute distress.    Appearance: Normal appearance. She is well-developed. She is not ill-appearing.  HENT:     Head: Normocephalic and atraumatic.     Right Ear: External ear normal.     Left Ear: External ear normal.     Nose:     Right Nostril: No foreign body, epistaxis or septal hematoma.     Left Nostril: Epistaxis present. No foreign body or septal hematoma.      Comments: Left-sided epistaxis, no septal hematoma Trace blood to posterior oropharynx    Mouth/Throat:     Mouth: Mucous membranes are moist.  Eyes:     General: No scleral icterus.       Right eye: No discharge.        Left eye: No discharge.  Cardiovascular:     Rate and Rhythm: Normal rate.  Pulmonary:      Effort: Pulmonary effort is normal. No respiratory distress.     Breath sounds: No stridor.  Abdominal:     General: Abdomen is flat. There is no distension.     Tenderness: There is no guarding.  Musculoskeletal:        General: No deformity.     Cervical back: No rigidity.  Skin:    General: Skin is warm and dry.     Coloration: Skin is not cyanotic, jaundiced or pale.  Neurological:     Mental Status: She is alert and oriented to person, place, and time.     GCS: GCS eye subscore is 4. GCS verbal subscore is 5. GCS motor subscore is 6.  Psychiatric:        Speech: Speech normal.        Behavior: Behavior normal. Behavior is cooperative.     ED Results and Treatments Labs (all labs ordered are listed, but only abnormal results are displayed) Labs Reviewed  RESP PANEL BY RT-PCR (RSV, FLU A&B, COVID)  [id number]                                                                                                                          Radiology No results found.  Pertinent labs & imaging results that were available during my care of the patient were reviewed by me and considered in my medical decision making (see MDM for details).  Medications Ordered in ED Medications  oxymetazoline  (AFRIN) 0.05 % nasal spray 1 spray (1 spray Each Nare Given 01/28/24 1117)  lidocaine -EPINEPHrine  (XYLOCAINE  W/EPI) 1 %-1:100000 (with pres) injection  10 mL (10 mLs Other Given 01/28/24 1118)  tranexamic acid  (CYKLOKAPRON ) 1000 MG/10ML topical solution 500 mg (500 mg Topical Given 01/28/24 1117)                                                                                                                                     Procedures Epistaxis Management  Date/Time: 02/02/2024 7:21 AM  Performed by: Elnor Jayson LABOR, DO Authorized by: Elnor Jayson LABOR, DO   Consent:    Consent obtained:  Verbal   Consent given by:  Patient   Risks, benefits, and alternatives were discussed: yes     Risks discussed:   Bleeding, infection and nasal injury   Alternatives discussed:  Delayed treatment and no treatment Universal protocol:    Procedure explained and questions answered to patient or proxy's satisfaction: yes     Immediately prior to procedure, a time out was called: yes     Patient identity confirmed:  Arm band and verbally with patient Anesthesia:    Anesthesia method:  None Procedure details:    Treatment site:  L anterior   Treatment method:  Anterior pack   Treatment complexity:  Limited   Treatment episode: initial   Post-procedure details:    Assessment:  Bleeding stopped   Procedure completion:  Tolerated well, no immediate complications   (including critical care time)  Medical Decision Making / ED Course    Medical Decision Making:    LOUIE MEADERS is a 53 y.o. female  with past medical history as below, significant for RA, sacral fracture, GAD, intracranial aneurysm who presents to the ED with complaint of epistaxis. The complaint involves an extensive differential diagnosis and also carries with it a high risk of complications and morbidity.  Serious etiology was considered. Ddx includes but is not limited to: Anterior epistaxis, posterior epistaxis, AVM, traumatic injury, etc.  Complete initial physical exam performed, notably the patient was in no distress, bleeding seems to be improving.    Reviewed and confirmed nursing documentation for past medical history, family history, social history.  Vital signs reviewed.    Epistaxis> - Spontaneous epistaxis per patient, appears to be left-sided, anterior. - resolved after packing w/ afrin/ lido w/ epi/txa - feeling better, no longer bleeding, no n/v, no blood remaining to posterior oropharynx, airway intact   Given nosebleed precautions, advised follow-up with PCP      Patient in no distress and overall condition is stable. Detailed discussions were had with the patient/guardian regarding current findings, and need for  close f/u with PCP or on call doctor. The patient/guardian has been instructed to return immediately if the symptoms worsen in any way for re-evaluation. Patient/guardian verbalized understanding and is in agreement with current care plan. All questions answered prior to discharge.                Additional history obtained: -Additional history obtained from na -External  records from outside source obtained and reviewed including: Chart review including previous notes, labs, imaging, consultation notes including  Home medications   Lab Tests: na  EKG   EKG Interpretation Date/Time:    Ventricular Rate:    PR Interval:    QRS Duration:    QT Interval:    QTC Calculation:   R Axis:      Text Interpretation:           Imaging Studies ordered: na   Medicines ordered and prescription drug management: Meds ordered this encounter  Medications   oxymetazoline  (AFRIN) 0.05 % nasal spray 1 spray   lidocaine -EPINEPHrine  (XYLOCAINE  W/EPI) 1 %-1:100000 (with pres) injection 10 mL   DISCONTD: tranexamic acid  (CYKLOKAPRON ) 1000 MG/10ML topical solution 500 mg   tranexamic acid  (CYKLOKAPRON ) 1000 MG/10ML topical solution 500 mg   DISCONTD: lidocaine -EPINEPHrine  (XYLOCAINE  W/EPI) 2 %-1:200000 (PF) injection    Pulliam, Iseley P: cabinet override    -I have reviewed the patients home medicines and have made adjustments as needed   Consultations Obtained: na   Cardiac Monitoring: Continuous pulse oximetry interpreted by myself, 100% on ra.    Social Determinants of Health:  Diagnosis or treatment significantly limited by social determinants of health: na   Reevaluation: After the interventions noted above, I reevaluated the patient and found that they have resolved  Co morbidities that complicate the patient evaluation  Past Medical History:  Diagnosis Date   RA (rheumatoid arthritis) (HCC)       Dispostion: Disposition decision including need for  hospitalization was considered, and patient discharged from emergency department.    Final Clinical Impression(s) / ED Diagnoses Final diagnoses:  Epistaxis        Elnor Jayson LABOR, DO 02/02/24 [id number]

## 2024-01-28 NOTE — ED Triage Notes (Signed)
 POV/ pt placed in WC/ nose bleed/ started 1 hr PTA/ both nostrils/ pt is A&OX4

## 2024-01-28 NOTE — ED Provider Triage Note (Signed)
 Emergency Medicine Provider Triage Evaluation Note  Gloria Bowman , a 53 y.o. female  was evaluated in triage.  Pt complains of nosebleed, no thinners. Pt here with nosebleed that began spontaneously, on plavix   Review of Systems  Positive: nosebleed Negative: syncope  Physical Exam  There were no vitals taken for this visit. Gen:   Awake, no distress   Resp:  Normal effort  MSK:   Moves extremities without difficulty  Other:  Epistaxis noted  Medical Decision Making  Medically screening exam initiated at 11:01 AM.  Appropriate orders placed.  Gloria Bowman was informed that the remainder of the evaluation will be completed by another provider, this initial triage assessment does not replace that evaluation, and the importance of remaining in the ED until their evaluation is complete.     Elnor Jayson LABOR, DO 02/02/24 [phone]
